# Patient Record
Sex: Male | Born: 1945 | Race: White | Hispanic: Yes | Marital: Married | Smoking: Never smoker
Health system: Southern US, Community
[De-identification: ages and names within clinical notes are randomized; demographics above are authoritative.]

## PROBLEM LIST (undated history)

## (undated) DIAGNOSIS — N529 Male erectile dysfunction, unspecified: Secondary | ICD-10-CM

## (undated) DIAGNOSIS — I471 Supraventricular tachycardia: Secondary | ICD-10-CM

## (undated) DIAGNOSIS — I4719 Other supraventricular tachycardia: Secondary | ICD-10-CM

## (undated) DIAGNOSIS — I10 Essential (primary) hypertension: Secondary | ICD-10-CM

## (undated) DIAGNOSIS — R002 Palpitations: Secondary | ICD-10-CM

## (undated) DIAGNOSIS — E785 Hyperlipidemia, unspecified: Secondary | ICD-10-CM

## (undated) DIAGNOSIS — R7303 Prediabetes: Secondary | ICD-10-CM

## (undated) HISTORY — DX: Male erectile dysfunction, unspecified: N52.9

## (undated) HISTORY — DX: Essential (primary) hypertension: I10

## (undated) HISTORY — DX: Supraventricular tachycardia: I47.1

## (undated) HISTORY — DX: Hyperlipidemia, unspecified: E78.5

## (undated) HISTORY — DX: Other supraventricular tachycardia: I47.19

## (undated) HISTORY — DX: Palpitations: R00.2

## (undated) HISTORY — DX: Prediabetes: R73.03

## (undated) HISTORY — PX: NO PAST SURGERIES: SHX2092

---

## 2018-10-07 DIAGNOSIS — R42 Dizziness and giddiness: Secondary | ICD-10-CM | POA: Diagnosis not present

## 2018-10-07 DIAGNOSIS — E785 Hyperlipidemia, unspecified: Secondary | ICD-10-CM | POA: Diagnosis not present

## 2018-10-07 DIAGNOSIS — R7301 Impaired fasting glucose: Secondary | ICD-10-CM | POA: Diagnosis not present

## 2018-10-07 DIAGNOSIS — Z23 Encounter for immunization: Secondary | ICD-10-CM | POA: Diagnosis not present

## 2018-10-07 DIAGNOSIS — I10 Essential (primary) hypertension: Secondary | ICD-10-CM | POA: Diagnosis not present

## 2018-11-30 DIAGNOSIS — I10 Essential (primary) hypertension: Secondary | ICD-10-CM | POA: Diagnosis not present

## 2018-11-30 DIAGNOSIS — Z131 Encounter for screening for diabetes mellitus: Secondary | ICD-10-CM | POA: Diagnosis not present

## 2018-11-30 DIAGNOSIS — E785 Hyperlipidemia, unspecified: Secondary | ICD-10-CM | POA: Diagnosis not present

## 2018-12-07 DIAGNOSIS — E785 Hyperlipidemia, unspecified: Secondary | ICD-10-CM | POA: Diagnosis not present

## 2018-12-07 DIAGNOSIS — I10 Essential (primary) hypertension: Secondary | ICD-10-CM | POA: Diagnosis not present

## 2018-12-07 DIAGNOSIS — E663 Overweight: Secondary | ICD-10-CM | POA: Diagnosis not present

## 2018-12-07 DIAGNOSIS — R7303 Prediabetes: Secondary | ICD-10-CM | POA: Diagnosis not present

## 2019-06-04 DIAGNOSIS — E785 Hyperlipidemia, unspecified: Secondary | ICD-10-CM | POA: Diagnosis not present

## 2019-06-04 DIAGNOSIS — I10 Essential (primary) hypertension: Secondary | ICD-10-CM | POA: Diagnosis not present

## 2019-06-04 DIAGNOSIS — R7303 Prediabetes: Secondary | ICD-10-CM | POA: Diagnosis not present

## 2019-06-11 DIAGNOSIS — E785 Hyperlipidemia, unspecified: Secondary | ICD-10-CM | POA: Diagnosis not present

## 2019-06-11 DIAGNOSIS — Z6829 Body mass index (BMI) 29.0-29.9, adult: Secondary | ICD-10-CM | POA: Diagnosis not present

## 2019-06-11 DIAGNOSIS — R7303 Prediabetes: Secondary | ICD-10-CM | POA: Diagnosis not present

## 2019-06-11 DIAGNOSIS — I10 Essential (primary) hypertension: Secondary | ICD-10-CM | POA: Diagnosis not present

## 2019-06-16 DIAGNOSIS — Z6827 Body mass index (BMI) 27.0-27.9, adult: Secondary | ICD-10-CM | POA: Diagnosis not present

## 2019-06-16 DIAGNOSIS — N529 Male erectile dysfunction, unspecified: Secondary | ICD-10-CM | POA: Diagnosis not present

## 2019-06-30 DIAGNOSIS — W57XXXA Bitten or stung by nonvenomous insect and other nonvenomous arthropods, initial encounter: Secondary | ICD-10-CM | POA: Diagnosis not present

## 2019-06-30 DIAGNOSIS — Z6826 Body mass index (BMI) 26.0-26.9, adult: Secondary | ICD-10-CM | POA: Diagnosis not present

## 2019-06-30 DIAGNOSIS — N529 Male erectile dysfunction, unspecified: Secondary | ICD-10-CM | POA: Diagnosis not present

## 2019-06-30 DIAGNOSIS — R972 Elevated prostate specific antigen [PSA]: Secondary | ICD-10-CM | POA: Diagnosis not present

## 2019-07-14 DIAGNOSIS — A770 Spotted fever due to Rickettsia rickettsii: Secondary | ICD-10-CM | POA: Diagnosis not present

## 2019-07-14 DIAGNOSIS — Z6827 Body mass index (BMI) 27.0-27.9, adult: Secondary | ICD-10-CM | POA: Diagnosis not present

## 2019-07-23 DIAGNOSIS — R69 Illness, unspecified: Secondary | ICD-10-CM | POA: Diagnosis not present

## 2019-07-28 DIAGNOSIS — N529 Male erectile dysfunction, unspecified: Secondary | ICD-10-CM | POA: Diagnosis not present

## 2019-07-28 DIAGNOSIS — N401 Enlarged prostate with lower urinary tract symptoms: Secondary | ICD-10-CM | POA: Diagnosis not present

## 2019-07-28 DIAGNOSIS — R972 Elevated prostate specific antigen [PSA]: Secondary | ICD-10-CM | POA: Diagnosis not present

## 2019-10-08 DIAGNOSIS — Z23 Encounter for immunization: Secondary | ICD-10-CM | POA: Diagnosis not present

## 2019-12-03 DIAGNOSIS — R7303 Prediabetes: Secondary | ICD-10-CM | POA: Diagnosis not present

## 2019-12-03 DIAGNOSIS — E785 Hyperlipidemia, unspecified: Secondary | ICD-10-CM | POA: Diagnosis not present

## 2019-12-03 DIAGNOSIS — I10 Essential (primary) hypertension: Secondary | ICD-10-CM | POA: Diagnosis not present

## 2019-12-03 DIAGNOSIS — Z125 Encounter for screening for malignant neoplasm of prostate: Secondary | ICD-10-CM | POA: Diagnosis not present

## 2019-12-10 DIAGNOSIS — Z136 Encounter for screening for cardiovascular disorders: Secondary | ICD-10-CM | POA: Diagnosis not present

## 2019-12-10 DIAGNOSIS — Z1331 Encounter for screening for depression: Secondary | ICD-10-CM | POA: Diagnosis not present

## 2019-12-10 DIAGNOSIS — E785 Hyperlipidemia, unspecified: Secondary | ICD-10-CM | POA: Diagnosis not present

## 2019-12-10 DIAGNOSIS — Z139 Encounter for screening, unspecified: Secondary | ICD-10-CM | POA: Diagnosis not present

## 2019-12-10 DIAGNOSIS — I10 Essential (primary) hypertension: Secondary | ICD-10-CM | POA: Diagnosis not present

## 2019-12-10 DIAGNOSIS — R7303 Prediabetes: Secondary | ICD-10-CM | POA: Diagnosis not present

## 2019-12-10 DIAGNOSIS — Z0000 Encounter for general adult medical examination without abnormal findings: Secondary | ICD-10-CM | POA: Diagnosis not present

## 2019-12-10 DIAGNOSIS — Z1339 Encounter for screening examination for other mental health and behavioral disorders: Secondary | ICD-10-CM | POA: Diagnosis not present

## 2019-12-10 DIAGNOSIS — Z6827 Body mass index (BMI) 27.0-27.9, adult: Secondary | ICD-10-CM | POA: Diagnosis not present

## 2019-12-10 DIAGNOSIS — Z7189 Other specified counseling: Secondary | ICD-10-CM | POA: Diagnosis not present

## 2019-12-20 DIAGNOSIS — H52209 Unspecified astigmatism, unspecified eye: Secondary | ICD-10-CM | POA: Diagnosis not present

## 2019-12-20 DIAGNOSIS — H5203 Hypermetropia, bilateral: Secondary | ICD-10-CM | POA: Diagnosis not present

## 2019-12-20 DIAGNOSIS — H524 Presbyopia: Secondary | ICD-10-CM | POA: Diagnosis not present

## 2020-03-07 DIAGNOSIS — R69 Illness, unspecified: Secondary | ICD-10-CM | POA: Diagnosis not present

## 2020-03-21 DIAGNOSIS — R972 Elevated prostate specific antigen [PSA]: Secondary | ICD-10-CM | POA: Diagnosis not present

## 2020-03-21 DIAGNOSIS — N529 Male erectile dysfunction, unspecified: Secondary | ICD-10-CM | POA: Diagnosis not present

## 2020-03-21 DIAGNOSIS — N401 Enlarged prostate with lower urinary tract symptoms: Secondary | ICD-10-CM | POA: Diagnosis not present

## 2020-03-22 DIAGNOSIS — R69 Illness, unspecified: Secondary | ICD-10-CM | POA: Diagnosis not present

## 2020-04-13 DIAGNOSIS — R972 Elevated prostate specific antigen [PSA]: Secondary | ICD-10-CM | POA: Diagnosis not present

## 2020-04-13 DIAGNOSIS — N401 Enlarged prostate with lower urinary tract symptoms: Secondary | ICD-10-CM | POA: Diagnosis not present

## 2020-04-19 DIAGNOSIS — R7303 Prediabetes: Secondary | ICD-10-CM | POA: Diagnosis not present

## 2020-04-19 DIAGNOSIS — E785 Hyperlipidemia, unspecified: Secondary | ICD-10-CM | POA: Diagnosis not present

## 2020-04-25 DIAGNOSIS — C61 Malignant neoplasm of prostate: Secondary | ICD-10-CM | POA: Diagnosis not present

## 2020-04-25 DIAGNOSIS — N401 Enlarged prostate with lower urinary tract symptoms: Secondary | ICD-10-CM | POA: Diagnosis not present

## 2020-04-25 DIAGNOSIS — R972 Elevated prostate specific antigen [PSA]: Secondary | ICD-10-CM | POA: Diagnosis not present

## 2020-04-28 DIAGNOSIS — Z6827 Body mass index (BMI) 27.0-27.9, adult: Secondary | ICD-10-CM | POA: Diagnosis not present

## 2020-04-28 DIAGNOSIS — E785 Hyperlipidemia, unspecified: Secondary | ICD-10-CM | POA: Diagnosis not present

## 2020-04-28 DIAGNOSIS — I10 Essential (primary) hypertension: Secondary | ICD-10-CM | POA: Diagnosis not present

## 2020-04-28 DIAGNOSIS — R7303 Prediabetes: Secondary | ICD-10-CM | POA: Diagnosis not present

## 2020-05-02 DIAGNOSIS — C61 Malignant neoplasm of prostate: Secondary | ICD-10-CM | POA: Diagnosis not present

## 2020-05-08 DIAGNOSIS — K5730 Diverticulosis of large intestine without perforation or abscess without bleeding: Secondary | ICD-10-CM | POA: Diagnosis not present

## 2020-05-08 DIAGNOSIS — C61 Malignant neoplasm of prostate: Secondary | ICD-10-CM | POA: Diagnosis not present

## 2020-05-08 DIAGNOSIS — I700 Atherosclerosis of aorta: Secondary | ICD-10-CM | POA: Diagnosis not present

## 2020-05-23 DIAGNOSIS — C61 Malignant neoplasm of prostate: Secondary | ICD-10-CM | POA: Diagnosis not present

## 2020-05-23 DIAGNOSIS — R351 Nocturia: Secondary | ICD-10-CM | POA: Diagnosis not present

## 2020-05-31 DIAGNOSIS — C61 Malignant neoplasm of prostate: Secondary | ICD-10-CM | POA: Diagnosis not present

## 2020-06-01 DIAGNOSIS — J309 Allergic rhinitis, unspecified: Secondary | ICD-10-CM | POA: Diagnosis not present

## 2020-06-01 DIAGNOSIS — Z008 Encounter for other general examination: Secondary | ICD-10-CM | POA: Diagnosis not present

## 2020-06-01 DIAGNOSIS — C61 Malignant neoplasm of prostate: Secondary | ICD-10-CM | POA: Diagnosis not present

## 2020-06-01 DIAGNOSIS — Z7982 Long term (current) use of aspirin: Secondary | ICD-10-CM | POA: Diagnosis not present

## 2020-06-01 DIAGNOSIS — Z880 Allergy status to penicillin: Secondary | ICD-10-CM | POA: Diagnosis not present

## 2020-06-01 DIAGNOSIS — N529 Male erectile dysfunction, unspecified: Secondary | ICD-10-CM | POA: Diagnosis not present

## 2020-06-01 DIAGNOSIS — E785 Hyperlipidemia, unspecified: Secondary | ICD-10-CM | POA: Diagnosis not present

## 2020-06-01 DIAGNOSIS — I10 Essential (primary) hypertension: Secondary | ICD-10-CM | POA: Diagnosis not present

## 2020-06-01 DIAGNOSIS — Z79899 Other long term (current) drug therapy: Secondary | ICD-10-CM | POA: Diagnosis not present

## 2020-06-06 DIAGNOSIS — C61 Malignant neoplasm of prostate: Secondary | ICD-10-CM | POA: Diagnosis not present

## 2020-07-07 DIAGNOSIS — R351 Nocturia: Secondary | ICD-10-CM | POA: Diagnosis not present

## 2020-07-07 DIAGNOSIS — C61 Malignant neoplasm of prostate: Secondary | ICD-10-CM | POA: Diagnosis not present

## 2020-08-08 DIAGNOSIS — R351 Nocturia: Secondary | ICD-10-CM | POA: Diagnosis not present

## 2020-08-08 DIAGNOSIS — C61 Malignant neoplasm of prostate: Secondary | ICD-10-CM | POA: Diagnosis not present

## 2020-08-23 DIAGNOSIS — C61 Malignant neoplasm of prostate: Secondary | ICD-10-CM | POA: Diagnosis not present

## 2020-08-23 DIAGNOSIS — Z510 Encounter for antineoplastic radiation therapy: Secondary | ICD-10-CM | POA: Diagnosis not present

## 2020-08-30 DIAGNOSIS — C61 Malignant neoplasm of prostate: Secondary | ICD-10-CM | POA: Diagnosis not present

## 2020-08-30 DIAGNOSIS — Z510 Encounter for antineoplastic radiation therapy: Secondary | ICD-10-CM | POA: Diagnosis not present

## 2020-08-31 DIAGNOSIS — C61 Malignant neoplasm of prostate: Secondary | ICD-10-CM | POA: Diagnosis not present

## 2020-08-31 DIAGNOSIS — Z510 Encounter for antineoplastic radiation therapy: Secondary | ICD-10-CM | POA: Diagnosis not present

## 2020-09-05 DIAGNOSIS — C61 Malignant neoplasm of prostate: Secondary | ICD-10-CM | POA: Diagnosis not present

## 2020-09-05 DIAGNOSIS — Z510 Encounter for antineoplastic radiation therapy: Secondary | ICD-10-CM | POA: Diagnosis not present

## 2020-09-06 DIAGNOSIS — Z510 Encounter for antineoplastic radiation therapy: Secondary | ICD-10-CM | POA: Diagnosis not present

## 2020-09-06 DIAGNOSIS — C61 Malignant neoplasm of prostate: Secondary | ICD-10-CM | POA: Diagnosis not present

## 2020-09-07 DIAGNOSIS — Z510 Encounter for antineoplastic radiation therapy: Secondary | ICD-10-CM | POA: Diagnosis not present

## 2020-09-07 DIAGNOSIS — C61 Malignant neoplasm of prostate: Secondary | ICD-10-CM | POA: Diagnosis not present

## 2020-09-08 DIAGNOSIS — C61 Malignant neoplasm of prostate: Secondary | ICD-10-CM | POA: Diagnosis not present

## 2020-09-08 DIAGNOSIS — Z510 Encounter for antineoplastic radiation therapy: Secondary | ICD-10-CM | POA: Diagnosis not present

## 2020-09-11 DIAGNOSIS — Z510 Encounter for antineoplastic radiation therapy: Secondary | ICD-10-CM | POA: Diagnosis not present

## 2020-09-11 DIAGNOSIS — C61 Malignant neoplasm of prostate: Secondary | ICD-10-CM | POA: Diagnosis not present

## 2020-09-12 DIAGNOSIS — Z510 Encounter for antineoplastic radiation therapy: Secondary | ICD-10-CM | POA: Diagnosis not present

## 2020-09-12 DIAGNOSIS — C61 Malignant neoplasm of prostate: Secondary | ICD-10-CM | POA: Diagnosis not present

## 2020-09-13 DIAGNOSIS — C61 Malignant neoplasm of prostate: Secondary | ICD-10-CM | POA: Diagnosis not present

## 2020-09-13 DIAGNOSIS — Z510 Encounter for antineoplastic radiation therapy: Secondary | ICD-10-CM | POA: Diagnosis not present

## 2020-09-14 DIAGNOSIS — C61 Malignant neoplasm of prostate: Secondary | ICD-10-CM | POA: Diagnosis not present

## 2020-09-14 DIAGNOSIS — Z510 Encounter for antineoplastic radiation therapy: Secondary | ICD-10-CM | POA: Diagnosis not present

## 2020-09-15 DIAGNOSIS — C61 Malignant neoplasm of prostate: Secondary | ICD-10-CM | POA: Diagnosis not present

## 2020-09-15 DIAGNOSIS — Z510 Encounter for antineoplastic radiation therapy: Secondary | ICD-10-CM | POA: Diagnosis not present

## 2020-09-18 DIAGNOSIS — C61 Malignant neoplasm of prostate: Secondary | ICD-10-CM | POA: Diagnosis not present

## 2020-09-18 DIAGNOSIS — Z510 Encounter for antineoplastic radiation therapy: Secondary | ICD-10-CM | POA: Diagnosis not present

## 2020-09-19 DIAGNOSIS — Z510 Encounter for antineoplastic radiation therapy: Secondary | ICD-10-CM | POA: Diagnosis not present

## 2020-09-19 DIAGNOSIS — C61 Malignant neoplasm of prostate: Secondary | ICD-10-CM | POA: Diagnosis not present

## 2020-09-20 DIAGNOSIS — C61 Malignant neoplasm of prostate: Secondary | ICD-10-CM | POA: Diagnosis not present

## 2020-09-20 DIAGNOSIS — Z510 Encounter for antineoplastic radiation therapy: Secondary | ICD-10-CM | POA: Diagnosis not present

## 2020-09-21 DIAGNOSIS — C61 Malignant neoplasm of prostate: Secondary | ICD-10-CM | POA: Diagnosis not present

## 2020-09-21 DIAGNOSIS — Z510 Encounter for antineoplastic radiation therapy: Secondary | ICD-10-CM | POA: Diagnosis not present

## 2020-09-22 DIAGNOSIS — C61 Malignant neoplasm of prostate: Secondary | ICD-10-CM | POA: Diagnosis not present

## 2020-09-22 DIAGNOSIS — R351 Nocturia: Secondary | ICD-10-CM | POA: Diagnosis not present

## 2020-09-22 DIAGNOSIS — Z510 Encounter for antineoplastic radiation therapy: Secondary | ICD-10-CM | POA: Diagnosis not present

## 2020-09-25 DIAGNOSIS — C61 Malignant neoplasm of prostate: Secondary | ICD-10-CM | POA: Diagnosis not present

## 2020-09-25 DIAGNOSIS — Z510 Encounter for antineoplastic radiation therapy: Secondary | ICD-10-CM | POA: Diagnosis not present

## 2020-09-26 DIAGNOSIS — C61 Malignant neoplasm of prostate: Secondary | ICD-10-CM | POA: Diagnosis not present

## 2020-09-26 DIAGNOSIS — Z510 Encounter for antineoplastic radiation therapy: Secondary | ICD-10-CM | POA: Diagnosis not present

## 2020-09-27 DIAGNOSIS — Z510 Encounter for antineoplastic radiation therapy: Secondary | ICD-10-CM | POA: Diagnosis not present

## 2020-09-27 DIAGNOSIS — C61 Malignant neoplasm of prostate: Secondary | ICD-10-CM | POA: Diagnosis not present

## 2020-09-28 DIAGNOSIS — Z510 Encounter for antineoplastic radiation therapy: Secondary | ICD-10-CM | POA: Diagnosis not present

## 2020-09-28 DIAGNOSIS — C61 Malignant neoplasm of prostate: Secondary | ICD-10-CM | POA: Diagnosis not present

## 2020-09-29 DIAGNOSIS — C61 Malignant neoplasm of prostate: Secondary | ICD-10-CM | POA: Diagnosis not present

## 2020-09-29 DIAGNOSIS — Z510 Encounter for antineoplastic radiation therapy: Secondary | ICD-10-CM | POA: Diagnosis not present

## 2020-10-02 DIAGNOSIS — C61 Malignant neoplasm of prostate: Secondary | ICD-10-CM | POA: Diagnosis not present

## 2020-10-02 DIAGNOSIS — Z510 Encounter for antineoplastic radiation therapy: Secondary | ICD-10-CM | POA: Diagnosis not present

## 2020-10-02 DIAGNOSIS — R69 Illness, unspecified: Secondary | ICD-10-CM | POA: Diagnosis not present

## 2020-10-03 DIAGNOSIS — Z510 Encounter for antineoplastic radiation therapy: Secondary | ICD-10-CM | POA: Diagnosis not present

## 2020-10-03 DIAGNOSIS — C61 Malignant neoplasm of prostate: Secondary | ICD-10-CM | POA: Diagnosis not present

## 2020-10-04 DIAGNOSIS — C61 Malignant neoplasm of prostate: Secondary | ICD-10-CM | POA: Diagnosis not present

## 2020-10-04 DIAGNOSIS — Z510 Encounter for antineoplastic radiation therapy: Secondary | ICD-10-CM | POA: Diagnosis not present

## 2020-10-05 DIAGNOSIS — C61 Malignant neoplasm of prostate: Secondary | ICD-10-CM | POA: Diagnosis not present

## 2020-10-05 DIAGNOSIS — Z510 Encounter for antineoplastic radiation therapy: Secondary | ICD-10-CM | POA: Diagnosis not present

## 2020-10-06 DIAGNOSIS — C61 Malignant neoplasm of prostate: Secondary | ICD-10-CM | POA: Diagnosis not present

## 2020-10-06 DIAGNOSIS — Z510 Encounter for antineoplastic radiation therapy: Secondary | ICD-10-CM | POA: Diagnosis not present

## 2020-10-09 DIAGNOSIS — Z510 Encounter for antineoplastic radiation therapy: Secondary | ICD-10-CM | POA: Diagnosis not present

## 2020-10-09 DIAGNOSIS — C61 Malignant neoplasm of prostate: Secondary | ICD-10-CM | POA: Diagnosis not present

## 2020-10-10 DIAGNOSIS — Z510 Encounter for antineoplastic radiation therapy: Secondary | ICD-10-CM | POA: Diagnosis not present

## 2020-10-10 DIAGNOSIS — C61 Malignant neoplasm of prostate: Secondary | ICD-10-CM | POA: Diagnosis not present

## 2020-10-11 DIAGNOSIS — C61 Malignant neoplasm of prostate: Secondary | ICD-10-CM | POA: Diagnosis not present

## 2020-10-11 DIAGNOSIS — Z510 Encounter for antineoplastic radiation therapy: Secondary | ICD-10-CM | POA: Diagnosis not present

## 2020-10-12 DIAGNOSIS — Z510 Encounter for antineoplastic radiation therapy: Secondary | ICD-10-CM | POA: Diagnosis not present

## 2020-10-12 DIAGNOSIS — C61 Malignant neoplasm of prostate: Secondary | ICD-10-CM | POA: Diagnosis not present

## 2020-10-13 DIAGNOSIS — C61 Malignant neoplasm of prostate: Secondary | ICD-10-CM | POA: Diagnosis not present

## 2020-10-13 DIAGNOSIS — Z510 Encounter for antineoplastic radiation therapy: Secondary | ICD-10-CM | POA: Diagnosis not present

## 2020-10-16 DIAGNOSIS — Z510 Encounter for antineoplastic radiation therapy: Secondary | ICD-10-CM | POA: Diagnosis not present

## 2020-10-16 DIAGNOSIS — C61 Malignant neoplasm of prostate: Secondary | ICD-10-CM | POA: Diagnosis not present

## 2020-10-17 DIAGNOSIS — C61 Malignant neoplasm of prostate: Secondary | ICD-10-CM | POA: Diagnosis not present

## 2020-10-17 DIAGNOSIS — Z510 Encounter for antineoplastic radiation therapy: Secondary | ICD-10-CM | POA: Diagnosis not present

## 2020-10-18 DIAGNOSIS — C61 Malignant neoplasm of prostate: Secondary | ICD-10-CM | POA: Diagnosis not present

## 2020-10-18 DIAGNOSIS — Z510 Encounter for antineoplastic radiation therapy: Secondary | ICD-10-CM | POA: Diagnosis not present

## 2020-10-19 DIAGNOSIS — Z510 Encounter for antineoplastic radiation therapy: Secondary | ICD-10-CM | POA: Diagnosis not present

## 2020-10-19 DIAGNOSIS — C61 Malignant neoplasm of prostate: Secondary | ICD-10-CM | POA: Diagnosis not present

## 2020-10-20 DIAGNOSIS — Z510 Encounter for antineoplastic radiation therapy: Secondary | ICD-10-CM | POA: Diagnosis not present

## 2020-10-20 DIAGNOSIS — C61 Malignant neoplasm of prostate: Secondary | ICD-10-CM | POA: Diagnosis not present

## 2020-10-20 DIAGNOSIS — R7303 Prediabetes: Secondary | ICD-10-CM | POA: Diagnosis not present

## 2020-10-20 DIAGNOSIS — E785 Hyperlipidemia, unspecified: Secondary | ICD-10-CM | POA: Diagnosis not present

## 2020-10-20 DIAGNOSIS — Z125 Encounter for screening for malignant neoplasm of prostate: Secondary | ICD-10-CM | POA: Diagnosis not present

## 2020-10-23 DIAGNOSIS — Z510 Encounter for antineoplastic radiation therapy: Secondary | ICD-10-CM | POA: Diagnosis not present

## 2020-10-23 DIAGNOSIS — C61 Malignant neoplasm of prostate: Secondary | ICD-10-CM | POA: Diagnosis not present

## 2020-10-24 DIAGNOSIS — C61 Malignant neoplasm of prostate: Secondary | ICD-10-CM | POA: Diagnosis not present

## 2020-10-24 DIAGNOSIS — Z510 Encounter for antineoplastic radiation therapy: Secondary | ICD-10-CM | POA: Diagnosis not present

## 2020-10-25 DIAGNOSIS — Z510 Encounter for antineoplastic radiation therapy: Secondary | ICD-10-CM | POA: Diagnosis not present

## 2020-10-25 DIAGNOSIS — R69 Illness, unspecified: Secondary | ICD-10-CM | POA: Diagnosis not present

## 2020-10-25 DIAGNOSIS — C61 Malignant neoplasm of prostate: Secondary | ICD-10-CM | POA: Diagnosis not present

## 2020-10-26 DIAGNOSIS — C61 Malignant neoplasm of prostate: Secondary | ICD-10-CM | POA: Diagnosis not present

## 2020-10-26 DIAGNOSIS — Z510 Encounter for antineoplastic radiation therapy: Secondary | ICD-10-CM | POA: Diagnosis not present

## 2020-10-27 DIAGNOSIS — Z23 Encounter for immunization: Secondary | ICD-10-CM | POA: Diagnosis not present

## 2020-10-27 DIAGNOSIS — Z510 Encounter for antineoplastic radiation therapy: Secondary | ICD-10-CM | POA: Diagnosis not present

## 2020-10-27 DIAGNOSIS — R7303 Prediabetes: Secondary | ICD-10-CM | POA: Diagnosis not present

## 2020-10-27 DIAGNOSIS — E785 Hyperlipidemia, unspecified: Secondary | ICD-10-CM | POA: Diagnosis not present

## 2020-10-27 DIAGNOSIS — I10 Essential (primary) hypertension: Secondary | ICD-10-CM | POA: Diagnosis not present

## 2020-10-27 DIAGNOSIS — C61 Malignant neoplasm of prostate: Secondary | ICD-10-CM | POA: Diagnosis not present

## 2020-10-30 DIAGNOSIS — Z510 Encounter for antineoplastic radiation therapy: Secondary | ICD-10-CM | POA: Diagnosis not present

## 2020-10-30 DIAGNOSIS — C61 Malignant neoplasm of prostate: Secondary | ICD-10-CM | POA: Diagnosis not present

## 2020-11-08 DIAGNOSIS — R351 Nocturia: Secondary | ICD-10-CM | POA: Diagnosis not present

## 2020-11-08 DIAGNOSIS — C61 Malignant neoplasm of prostate: Secondary | ICD-10-CM | POA: Diagnosis not present

## 2021-03-08 DIAGNOSIS — R351 Nocturia: Secondary | ICD-10-CM | POA: Diagnosis not present

## 2021-03-08 DIAGNOSIS — C61 Malignant neoplasm of prostate: Secondary | ICD-10-CM | POA: Diagnosis not present

## 2021-04-05 DIAGNOSIS — H5203 Hypermetropia, bilateral: Secondary | ICD-10-CM | POA: Diagnosis not present

## 2021-04-16 DIAGNOSIS — E785 Hyperlipidemia, unspecified: Secondary | ICD-10-CM | POA: Diagnosis not present

## 2021-04-16 DIAGNOSIS — R7303 Prediabetes: Secondary | ICD-10-CM | POA: Diagnosis not present

## 2021-04-23 DIAGNOSIS — Z139 Encounter for screening, unspecified: Secondary | ICD-10-CM | POA: Diagnosis not present

## 2021-04-23 DIAGNOSIS — I10 Essential (primary) hypertension: Secondary | ICD-10-CM | POA: Diagnosis not present

## 2021-04-23 DIAGNOSIS — Z23 Encounter for immunization: Secondary | ICD-10-CM | POA: Diagnosis not present

## 2021-04-23 DIAGNOSIS — E785 Hyperlipidemia, unspecified: Secondary | ICD-10-CM | POA: Diagnosis not present

## 2021-04-23 DIAGNOSIS — R7303 Prediabetes: Secondary | ICD-10-CM | POA: Diagnosis not present

## 2021-06-08 DIAGNOSIS — N529 Male erectile dysfunction, unspecified: Secondary | ICD-10-CM | POA: Diagnosis not present

## 2021-06-08 DIAGNOSIS — C61 Malignant neoplasm of prostate: Secondary | ICD-10-CM | POA: Diagnosis not present

## 2021-06-08 DIAGNOSIS — R351 Nocturia: Secondary | ICD-10-CM | POA: Diagnosis not present

## 2021-06-20 DIAGNOSIS — Z6827 Body mass index (BMI) 27.0-27.9, adult: Secondary | ICD-10-CM | POA: Diagnosis not present

## 2021-06-20 DIAGNOSIS — R002 Palpitations: Secondary | ICD-10-CM | POA: Diagnosis not present

## 2021-07-20 DIAGNOSIS — R002 Palpitations: Secondary | ICD-10-CM | POA: Diagnosis not present

## 2021-07-20 DIAGNOSIS — I471 Supraventricular tachycardia: Secondary | ICD-10-CM | POA: Diagnosis not present

## 2021-07-20 DIAGNOSIS — Z6827 Body mass index (BMI) 27.0-27.9, adult: Secondary | ICD-10-CM | POA: Diagnosis not present

## 2021-07-26 ENCOUNTER — Encounter: Payer: Self-pay | Admitting: Cardiology

## 2021-08-22 DIAGNOSIS — E785 Hyperlipidemia, unspecified: Secondary | ICD-10-CM | POA: Diagnosis not present

## 2021-08-22 DIAGNOSIS — I10 Essential (primary) hypertension: Secondary | ICD-10-CM | POA: Diagnosis not present

## 2021-08-22 DIAGNOSIS — N529 Male erectile dysfunction, unspecified: Secondary | ICD-10-CM | POA: Diagnosis not present

## 2021-08-22 DIAGNOSIS — Z79899 Other long term (current) drug therapy: Secondary | ICD-10-CM | POA: Diagnosis not present

## 2021-08-22 DIAGNOSIS — Z8249 Family history of ischemic heart disease and other diseases of the circulatory system: Secondary | ICD-10-CM | POA: Diagnosis not present

## 2021-08-22 DIAGNOSIS — J309 Allergic rhinitis, unspecified: Secondary | ICD-10-CM | POA: Diagnosis not present

## 2021-08-22 DIAGNOSIS — Z7982 Long term (current) use of aspirin: Secondary | ICD-10-CM | POA: Diagnosis not present

## 2021-08-22 DIAGNOSIS — E663 Overweight: Secondary | ICD-10-CM | POA: Diagnosis not present

## 2021-08-22 DIAGNOSIS — Z6826 Body mass index (BMI) 26.0-26.9, adult: Secondary | ICD-10-CM | POA: Diagnosis not present

## 2021-08-22 DIAGNOSIS — I499 Cardiac arrhythmia, unspecified: Secondary | ICD-10-CM | POA: Diagnosis not present

## 2021-09-21 DIAGNOSIS — I471 Supraventricular tachycardia: Secondary | ICD-10-CM | POA: Diagnosis not present

## 2021-09-21 DIAGNOSIS — Z23 Encounter for immunization: Secondary | ICD-10-CM | POA: Diagnosis not present

## 2021-09-21 DIAGNOSIS — Z6828 Body mass index (BMI) 28.0-28.9, adult: Secondary | ICD-10-CM | POA: Diagnosis not present

## 2021-10-05 ENCOUNTER — Ambulatory Visit (INDEPENDENT_AMBULATORY_CARE_PROVIDER_SITE_OTHER): Payer: Medicare HMO

## 2021-10-05 ENCOUNTER — Other Ambulatory Visit: Payer: Self-pay

## 2021-10-05 ENCOUNTER — Encounter: Payer: Self-pay | Admitting: Cardiology

## 2021-10-05 ENCOUNTER — Ambulatory Visit (INDEPENDENT_AMBULATORY_CARE_PROVIDER_SITE_OTHER): Payer: Medicare HMO | Admitting: Cardiology

## 2021-10-05 VITALS — BP 88/46 | HR 145 | Ht 66.0 in | Wt 170.8 lb

## 2021-10-05 DIAGNOSIS — I4892 Unspecified atrial flutter: Secondary | ICD-10-CM | POA: Diagnosis not present

## 2021-10-05 DIAGNOSIS — I483 Typical atrial flutter: Secondary | ICD-10-CM | POA: Diagnosis not present

## 2021-10-05 DIAGNOSIS — R7303 Prediabetes: Secondary | ICD-10-CM

## 2021-10-05 DIAGNOSIS — I10 Essential (primary) hypertension: Secondary | ICD-10-CM | POA: Insufficient documentation

## 2021-10-05 DIAGNOSIS — E785 Hyperlipidemia, unspecified: Secondary | ICD-10-CM

## 2021-10-05 HISTORY — DX: Essential (primary) hypertension: I10

## 2021-10-05 HISTORY — DX: Unspecified atrial flutter: I48.92

## 2021-10-05 HISTORY — DX: Hyperlipidemia, unspecified: E78.5

## 2021-10-05 HISTORY — DX: Prediabetes: R73.03

## 2021-10-05 MED ORDER — METOPROLOL SUCCINATE ER 50 MG PO TB24: 50.0000 mg | ORAL_TABLET | Freq: Every day | ORAL | 3 refills | Status: DC

## 2021-10-05 NOTE — Patient Instructions (Signed)
Medication Instructions:  Your physician has recommended you make the following change in your medication:  STOP: METOPROLOL (Lopressor)  STOP: Enalapril  *If you need a refill on your cardiac medications before your next appointment, please call your pharmacy*   Lab Work: Your physician recommends that you return for lab work in: TODAY PT/INR, CBC, BMP, IFOB If you have labs (blood work) drawn today and your tests are completely normal, you will receive your results only by: [place] (if you have MyChart) OR A paper copy in the mail If you have any lab test that is abnormal or we need to change your treatment, we will call you to review the results.   Testing/Procedures: Your physician has requested that you have an echocardiogram. Echocardiography is a painless test that uses sound waves to create images of your heart. It provides your doctor with information about the size and shape of your heart and how well your heart's chambers and valves are working. This procedure takes approximately one hour. There are no restrictions for this procedure.  A zio monitor was ordered today. It will remain on for 7 days. You will then return monitor and event diary in provided box. It takes 1-2 weeks for report to be downloaded and returned to [place]. We will call you with the results. If monitor falls off or has orange flashing light, please call Zio for further instructions.     Follow-Up: At [facility], you and your health needs are our priority.  As part of our continuing mission to provide you with exceptional heart care, we have created designated Provider Care Teams.  These Care Teams include your primary Cardiologist (physician) and Advanced Practice Providers (APPs -  Physician Assistants and Nurse Practitioners) who all work together to provide you with the care you need, when you need it.  We recommend signing up for the patient portal called "MyChart".  Sign up information is  provided on this After Visit Summary.  MyChart is used to connect with patients for Virtual Visits (Telemedicine).  Patients are able to view lab/test results, encounter notes, upcoming appointments, etc.  Non-urgent messages can be sent to your provider as well.   To learn more about what you can do with MyChart, go to [email or web address].    Your next appointment:   3 week(s)  The format for your next appointment:   In Person  Provider:   Jenne Campus, MD   Other Instructions

## 2021-10-05 NOTE — Progress Notes (Signed)
Cardiology Consultation:    Date:  10/05/2021   ID:  Demarqus, Jocson 21-Feb-1946, MRN [id number]  PCP:  Maryella Shivers, MD  Cardiologist:  Jenne Campus, MD   Referring MD: Maryella Shivers, MD   Chief Complaint  Patient presents with   Palpitations   Tachycardia   elevated HR    Ongoing for     History of Present Illness:    Maxwell Hopkins is a 75 y.o. male who is being seen today for the evaluation of tachycardia at the request of Maryella Shivers, MD. past medical history significant for essential hypertension, dyslipidemia, borderline diabetes.  In the summertime he was noted to be tachycardic.  His EKG at that time showed atrial flutter.  He was eventually referred to [place].  He has been given metoprolol 25 mg that he takes once a day which calm his heart rate down.  He tells me that overall he is doing well.  He denies have any chest pain tightness squeezing pressure burning chest he describes having fatigue and tiredness when his heart speeds up.  Usually he said when he wakes up in the morning his heart will beat fast that he takes metoprolol and heart quiet down.  He never had a problem like this before.  He does not smoke does not have family history of premature coronary artery disease.  Never had a stroke or TIA.  He is still active and have no difficulty doing things  Past Medical History:  Diagnosis Date   Atrial tachycardia [facility])    ED (erectile dysfunction)    Hyperlipidemia    Hypertension    Palpitations    Prediabetes     Past Surgical History:  Procedure Laterality Date   NO PAST SURGERIES      Current Medications: Current Meds  Medication Sig   aspirin EC 81 MG tablet Take 81 mg by mouth daily. Swallow whole.   atorvastatin (LIPITOR) 40 MG tablet Take 40 mg by mouth daily.   fluticasone (FLONASE) 50 MCG/ACT nasal spray Place 1 spray into both nostrils daily.   metoprolol succinate (TOPROL-XL) 50 MG 24 hr tablet Take 1 tablet (50 mg total)  by mouth daily. Take with or immediately following a meal.   tadalafil (CIALIS) 5 MG tablet Take 5 mg by mouth daily.   [DISCONTINUED] enalapril (VASOTEC) 5 MG tablet Take 2.5 mg by mouth daily.   [DISCONTINUED] metoprolol tartrate (LOPRESSOR) 25 MG tablet Take 25 mg by mouth 2 (two) times daily.     Allergies:   Penicillin g   Social History   Socioeconomic History   Marital status: Married    Spouse name: Not on file   Number of children: Not on file   Years of education: Not on file   Highest education level: Not on file  Occupational History   Not on file  Tobacco Use   Smoking status: Never   Smokeless tobacco: Never  Substance and Sexual Activity   Alcohol use: Not Currently   Drug use: Never   Sexual activity: Yes  Other Topics Concern   Not on file  Social History Narrative   Not on file   Social Determinants of Health   Financial Resource Strain: Not on file  Food Insecurity: Not on file  Transportation Needs: Not on file  Physical Activity: Not on file  Stress: Not on file  Social Connections: Not on file     Family History: The patient's Family history is unknown by  patient. ROS:   Please see the history of present illness.    All 14 point review of systems negative except as described per history of present illness.  EKGs/Labs/Other Studies Reviewed:    The following studies were reviewed today: Atrial flutter with 2 1 AV conduction  EKG:    The ekg ordered today demonstrates atrial flutter with 221 AV conduction  Recent Labs: No results found for requested labs within last 8760 hours.  Recent Lipid Panel No results found for: CHOL, TRIG, HDL, CHOLHDL, VLDL, LDLCALC, LDLDIRECT  Physical Exam:    VS:  BP (!) 88/46 (BP Location: Left Arm, Patient Position: Sitting)   Pulse (!) 145   Ht 5\' 6"  (1.676 m)   Wt 170 lb 12.8 oz (77.5 kg)   SpO2 97%   BMI 27.57 kg/m     Wt Readings from Last 3 Encounters:  10/05/21 170 lb 12.8 oz (77.5 kg)   07/20/21 166 lb 2 oz (75.4 kg)     GEN:  Well nourished, well developed in no acute distress HEENT: Normal NECK: No JVD; No carotid bruits LYMPHATICS: No lymphadenopathy CARDIAC: RRR, tachycardic, no murmurs, no rubs, no gallops RESPIRATORY:  Clear to auscultation without rales, wheezing or rhonchi  ABDOMEN: Soft, non-tender, non-distended MUSCULOSKELETAL:  No edema; No deformity  SKIN: Warm and dry NEUROLOGIC:  Alert and oriented x 3 PSYCHIATRIC:  Normal affect   ASSESSMENT:    1. Atrial flutter, unspecified type ([place])   2. Typical atrial flutter ([place])   3. Essential hypertension   4. Dyslipidemia   5. Borderline diabetes    PLAN:    In order of problems listed above:  Typical atrial flutter.  Rate is uncontrolled.  His blood pressure is low.  Patient is completely asymptomatic we will even talk about potentially putting him in the hospital to quickly manage this problem but since he is absolutely completely asymptomatic I think we can try to manage this as an outpatient.  I will ask him to stop ACE inhibitor he is taking enalapril and will discontinue that I will switch him to long-acting for metoprolol and I will ask him to start taking 50 mg metoprolol succinate every single day in the morning.  His CHADS2 Vascor equals 3, therefore, he need to be anticoagulated.  I will check his CBC Chem-7 PT/INR stool for guaiac and if everything is fine we will stop his aspirin and put him on Eliquis 5 mg twice daily.  He also will be scheduled to have an echocardiogram to assess left ventricle ejection fraction.  I will schedule him to have a Zio patch for 1 week to see if his ventricular rate is reasonably controlled.  After 4 weeks of anticoagulation we can start thinking about potentially getting back to normal rhythm he will most likely require cardioversion. Essential hypertension his blood pressure actually is low today but he is tachycardic in atrial flutter.  We will adjust his  medication as described above. Dyslipidemia, he is taking atorvastatin 40 I do have his fasting lipid profile from April 16, 2021 with LDL 100 HDL 43 Borderline diabetes.  I do see his hemoglobin A1c from 04/16/2021 which is 6.1   Medication Adjustments/Labs and Tests Ordered: Current medicines are reviewed at length with the patient today.  Concerns regarding medicines are outlined above.  Orders Placed This Encounter  Procedures   Fecal occult blood, imunochemical([facility])   Protime-INR   Basic metabolic panel   CBC   LONG TERM MONITOR (3-14  DAYS)   EKG 12-Lead   ECHOCARDIOGRAM COMPLETE   Meds ordered this encounter  Medications   metoprolol succinate (TOPROL-XL) 50 MG 24 hr tablet    Sig: Take 1 tablet (50 mg total) by mouth daily. Take with or immediately following a meal.    Dispense:  90 tablet    Refill:  3    Signed, Park Liter, MD, [facility]. 10/05/2021 11:51 AM    [facility]

## 2021-10-06 LAB — BASIC METABOLIC PANEL
BUN/Creatinine Ratio: 21 (ref 10–24)
BUN: 19 mg/dL (ref 8–27)
CO2: 24 mmol/L (ref 20–29)
Calcium: 9.7 mg/dL (ref 8.6–10.2)
Chloride: 101 mmol/L (ref 96–106)
Creatinine, Ser: 0.89 mg/dL (ref 0.76–1.27)
Glucose: 112 mg/dL — ABNORMAL HIGH (ref 70–99)
Potassium: 4.7 mmol/L (ref 3.5–5.2)
Sodium: 139 mmol/L (ref 134–144)
eGFR: 90 mL/min/{1.73_m2} (ref >59)

## 2021-10-06 LAB — CBC
Hematocrit: 38.5 % (ref 37.5–51.0)
Hemoglobin: 13 g/dL (ref 13.0–17.7)
MCH: 32.7 pg (ref 26.6–33.0)
MCHC: 33.8 g/dL (ref 31.5–35.7)
MCV: 97 fL (ref 79–97)
Platelets: 196 10*3/uL (ref 150–450)
RBC: 3.97 x10E6/uL — ABNORMAL LOW (ref 4.14–5.80)
RDW: 12.6 % (ref 11.6–15.4)
WBC: 5.6 10*3/uL (ref 3.4–10.8)

## 2021-10-06 LAB — PROTIME-INR
INR: 1 (ref 0.9–1.2)
Prothrombin Time: 10.6 s (ref 9.1–12.0)

## 2021-10-09 ENCOUNTER — Telehealth: Payer: Self-pay | Admitting: Emergency Medicine

## 2021-10-09 DIAGNOSIS — Z79899 Other long term (current) drug therapy: Secondary | ICD-10-CM

## 2021-10-09 MED ORDER — APIXABAN 5 MG PO TABS: 5.0000 mg | ORAL_TABLET | Freq: Two times a day (BID) | ORAL | 2 refills | Status: DC

## 2021-10-09 NOTE — Telephone Encounter (Signed)
-----   Message from Park Liter, MD sent at 10/08/2021  9:38 PM EDT ----- Stop aspirin, start Eliquis 5 mg twice daily, lets do H&H in about 10 days

## 2021-10-09 NOTE — Telephone Encounter (Signed)
Spoke to patient via interpreter. Patient informed of results. He will stop aspirin and start eliquis 5 mg twice daily. He will have blood work drawn in 10 days.

## 2021-10-12 ENCOUNTER — Telehealth: Payer: Self-pay | Admitting: Cardiology

## 2021-10-12 DIAGNOSIS — I4892 Unspecified atrial flutter: Secondary | ICD-10-CM

## 2021-10-12 NOTE — Telephone Encounter (Signed)
Patient states he removed his heart monitor and now it is flashing red.  He would like to know if this is normal. Please advise.  He requested a call back from a Spanish speaking interpreter.

## 2021-10-12 NOTE — Telephone Encounter (Signed)
Spoke to the patient just now using the interpreter and the patient stated that he had already spoken to someone regarding the monitor. He said he had no further questions.

## 2021-10-19 DIAGNOSIS — Z79899 Other long term (current) drug therapy: Secondary | ICD-10-CM | POA: Diagnosis not present

## 2021-10-20 LAB — HEMOGLOBIN AND HEMATOCRIT, BLOOD
Hematocrit: 34.2 % — ABNORMAL LOW (ref 37.5–51.0)
Hemoglobin: 11.7 g/dL — ABNORMAL LOW (ref 13.0–17.7)

## 2021-10-22 DIAGNOSIS — I4892 Unspecified atrial flutter: Secondary | ICD-10-CM | POA: Diagnosis not present

## 2021-10-23 ENCOUNTER — Telehealth: Payer: Self-pay

## 2021-10-23 DIAGNOSIS — I4892 Unspecified atrial flutter: Secondary | ICD-10-CM | POA: Diagnosis not present

## 2021-10-23 NOTE — Telephone Encounter (Signed)
Patient aware.

## 2021-10-23 NOTE — Telephone Encounter (Signed)
-----   Message from Park Liter, MD sent at 10/22/2021 10:06 AM EDT ----- Labs showed minimal decrease in H&H.  Please repeat H&H in 2 weeks

## 2021-10-23 NOTE — Telephone Encounter (Signed)
-----   Message from Park Liter, MD sent at 10/23/2021  9:57 AM EDT ----- I do not see any issue with restarting those vitamins. ----- Message ----- From: Darrel Reach, CMA Sent: 10/23/2021   9:26 AM EDT To: Park Liter, MD  Patient notified of results and recommendations, agree with plan.  Patient asked can he resume taking Vitamin E 800mg  and C 1000mg  daily? Please advise  ----- Message ----- From: Park Liter, MD Sent: 10/22/2021  10:06 AM EDT To: Senaida Ores, RN  Labs showed minimal decrease in H&H.  Please repeat H&H in 2 weeks

## 2021-10-23 NOTE — Telephone Encounter (Signed)
Patient notified of results and recommendations, agree with plan. Order placed, aware to come by in 2 week. Patient did ask if he could resume his Vitamins E and C. Message sent to Dr. Raliegh Ip.

## 2021-10-25 ENCOUNTER — Ambulatory Visit (INDEPENDENT_AMBULATORY_CARE_PROVIDER_SITE_OTHER): Payer: Medicare HMO

## 2021-10-25 ENCOUNTER — Other Ambulatory Visit: Payer: Self-pay

## 2021-10-25 ENCOUNTER — Telehealth: Payer: Self-pay

## 2021-10-25 DIAGNOSIS — I4892 Unspecified atrial flutter: Secondary | ICD-10-CM | POA: Diagnosis not present

## 2021-10-25 LAB — ECHOCARDIOGRAM COMPLETE
Area-P 1/2: 7.74 cm2
S' Lateral: 2.6 cm

## 2021-10-25 LAB — FECAL OCCULT BLOOD, IMMUNOCHEMICAL: Fecal Occult Bld: NEGATIVE

## 2021-10-25 MED ORDER — METOPROLOL SUCCINATE ER 50 MG PO TB24: ORAL_TABLET | ORAL | 1 refills | Status: DC

## 2021-10-25 NOTE — Telephone Encounter (Signed)
I called [facility], left a detailed message regarding previous message. I have not received the clearance form.

## 2021-10-25 NOTE — Telephone Encounter (Signed)
Patient stopped by with a medical release, the form looked as if the office was requesting a clearance for a dental procedure. I spoke to [name] and she confirmed clearance is what their requesting.Maxwell Hopkins is faxing the form. I will be looking out for this.

## 2021-10-25 NOTE — Telephone Encounter (Signed)
Per Dr. Raliegh Ip ok to take an extra 1/2 of tab in the evenings. Patient notified, rx sent

## 2021-10-26 ENCOUNTER — Telehealth: Payer: Self-pay

## 2021-10-26 NOTE — Telephone Encounter (Signed)
Patient aware i've tried in multiple attempts to reach out to [employer] regarding clearance form needed to initiate clearance. Patient will call them on Monday.

## 2021-10-29 ENCOUNTER — Telehealth: Payer: Self-pay

## 2021-10-29 NOTE — Telephone Encounter (Signed)
   [facility] Pre-operative Risk Assessment    Request for surgical clearance:  What type of surgery is being performed? Dental filling  on 28-OB  When is this surgery scheduled? TBD   What type of clearance is required (medical clearance vs. Pharmacy clearance to hold med vs. Both)? Pharmacy    Are there any medications that need to be held prior to surgery and how long?Eliquis, holding length not specified    Practice name and name of physician performing surgery?  Dr. Andrez Grime at [facility]   What is your office phone number: [phone] ext [phone]    7.   What is your office fax number: [phone]  8.   Anesthesia type (None, local, MAC, general) ? Not specified    Basil Dess Sarthak Rubenstein 10/29/2021, 2:36 PM  _________________________________________________________________   (provider comments below)

## 2021-10-29 NOTE — Telephone Encounter (Signed)
   Patient Name: Maxwell Hopkins  DOB: 1946/05/28 MRN: [id number]  Primary Cardiologist: None  Chart reviewed as part of pre-operative protocol coverage.   Simple dental extractions and fillings are considered low risk procedures per guidelines and generally do not require any specific cardiac clearance. It is also generally accepted that for simple extractions, fillings, and dental cleanings, there is no need to interrupt blood thinner therapy.  SBE prophylaxis is not required for the patient from a cardiac standpoint.  I will route this recommendation to the requesting party via Epic fax function and remove from pre-op pool.  Please call with questions.  Abigail Butts, PA-C 10/29/2021, 2:56 PM

## 2021-10-31 ENCOUNTER — Telehealth: Payer: Self-pay

## 2021-10-31 NOTE — Telephone Encounter (Signed)
-----   Message from Park Liter, MD sent at 10/30/2021  2:10 PM EDT ----- Echocardiogram reviewed showing normal left ventricle ejection fraction, left ventricle hypertrophy, in the left atrium there is small tiny echodensity.  We will talk about this during the visit

## 2021-11-01 NOTE — Telephone Encounter (Signed)
Spoke with the patient results informed in [place]. Patient verbalized understanding.

## 2021-11-02 ENCOUNTER — Telehealth: Payer: Self-pay | Admitting: *Deleted

## 2021-11-02 DIAGNOSIS — I10 Essential (primary) hypertension: Secondary | ICD-10-CM | POA: Insufficient documentation

## 2021-11-02 DIAGNOSIS — E785 Hyperlipidemia, unspecified: Secondary | ICD-10-CM | POA: Diagnosis not present

## 2021-11-02 DIAGNOSIS — N529 Male erectile dysfunction, unspecified: Secondary | ICD-10-CM | POA: Insufficient documentation

## 2021-11-02 DIAGNOSIS — R7303 Prediabetes: Secondary | ICD-10-CM | POA: Diagnosis not present

## 2021-11-02 DIAGNOSIS — I471 Supraventricular tachycardia: Secondary | ICD-10-CM | POA: Insufficient documentation

## 2021-11-02 DIAGNOSIS — Z125 Encounter for screening for malignant neoplasm of prostate: Secondary | ICD-10-CM | POA: Diagnosis not present

## 2021-11-02 DIAGNOSIS — R002 Palpitations: Secondary | ICD-10-CM | POA: Insufficient documentation

## 2021-11-02 NOTE — Telephone Encounter (Signed)
Pt came in because he was told to come in 2 weeks to get labs drawn; there is nothing in the chart about what he needed. Please advise

## 2021-11-06 NOTE — Telephone Encounter (Signed)
Spoke with patient, aware no blood work is needed.

## 2021-11-07 ENCOUNTER — Ambulatory Visit (INDEPENDENT_AMBULATORY_CARE_PROVIDER_SITE_OTHER): Payer: Medicare HMO | Admitting: Cardiology

## 2021-11-07 ENCOUNTER — Encounter: Payer: Self-pay | Admitting: Cardiology

## 2021-11-07 ENCOUNTER — Ambulatory Visit: Payer: Medicare HMO | Admitting: Cardiology

## 2021-11-07 ENCOUNTER — Other Ambulatory Visit: Payer: Self-pay

## 2021-11-07 VITALS — BP 106/56 | HR 74 | Ht 66.0 in | Wt 173.4 lb

## 2021-11-07 DIAGNOSIS — E785 Hyperlipidemia, unspecified: Secondary | ICD-10-CM | POA: Diagnosis not present

## 2021-11-07 DIAGNOSIS — I10 Essential (primary) hypertension: Secondary | ICD-10-CM | POA: Diagnosis not present

## 2021-11-07 DIAGNOSIS — R002 Palpitations: Secondary | ICD-10-CM

## 2021-11-07 DIAGNOSIS — I480 Paroxysmal atrial fibrillation: Secondary | ICD-10-CM | POA: Insufficient documentation

## 2021-11-07 DIAGNOSIS — I483 Typical atrial flutter: Secondary | ICD-10-CM | POA: Diagnosis not present

## 2021-11-07 DIAGNOSIS — R931 Abnormal findings on diagnostic imaging of heart and coronary circulation: Secondary | ICD-10-CM

## 2021-11-07 HISTORY — DX: Paroxysmal atrial fibrillation: I48.0

## 2021-11-07 NOTE — Progress Notes (Signed)
Cardiology Office Note:    Date:  11/07/2021   ID:  Dalonte, Hardage 01-04-1946, MRN [id number]  PCP:  Maryella Shivers, MD  Cardiologist:  Jenne Campus, MD    Referring MD: Maryella Shivers, MD   No chief complaint on file. Doing well  History of Present Illness:    MINDY BEHNKEN is a 75 y.o. male with past medical history significant for paroxysmal atrial flutter proximal atrial fibrillation, essential hypertension, dyslipidemia, borderline diabetes.  He was referred to [place] because of episode of palpitations.  He is fine to have blood proximal mitral fibrillation as well as paroxysmal atrial flutter.  He is being anticoagulated very appropriately.  Echocardiogram has been performed showed preserved ejection fraction surprisingly there is tiny targets noted within the left atrium.  We did have a discussion today about what to do with the situation.  He did for monitor monitor show 13% burden of his atrial fibrillation/flutter.  Overall he is doing well.  Denies have any chest pain tightness squeezing pressure burning chest overall looks good.  Past Medical History:  Diagnosis Date   Atrial flutter ([place]) 10/05/2021   Atrial tachycardia ([place])    Borderline diabetes 10/05/2021   Dyslipidemia 10/05/2021   ED (erectile dysfunction)    Essential hypertension 10/05/2021   Hyperlipidemia    Hypertension    Palpitations    Prediabetes     Past Surgical History:  Procedure Laterality Date   NO PAST SURGERIES      Current Medications: Current Meds  Medication Sig   apixaban (ELIQUIS) 5 MG TABS tablet Take 1 tablet (5 mg total) by mouth 2 (two) times daily.   atorvastatin (LIPITOR) 40 MG tablet Take 40 mg by mouth daily.   fluticasone (FLONASE) 50 MCG/ACT nasal spray Place 1 spray into both nostrils daily.   metoprolol succinate (TOPROL-XL) 50 MG 24 hr tablet Take 1 tab in the morning and 1/2 tab in the evening .   tadalafil (CIALIS) 5 MG tablet Take 5 mg by mouth daily.      Allergies:   Penicillin g   Social History   Socioeconomic History   Marital status: Married    Spouse name: Not on file   Number of children: Not on file   Years of education: Not on file   Highest education level: Not on file  Occupational History   Not on file  Tobacco Use   Smoking status: Never   Smokeless tobacco: Never  Substance and Sexual Activity   Alcohol use: Not Currently   Drug use: Never   Sexual activity: Yes  Other Topics Concern   Not on file  Social History Narrative   Not on file   Social Determinants of Health   Financial Resource Strain: Not on file  Food Insecurity: Not on file  Transportation Needs: Not on file  Physical Activity: Not on file  Stress: Not on file  Social Connections: Not on file     Family History: The patient's Family history is unknown by patient. ROS:   Please see the history of present illness.    All 14 point review of systems negative except as described per history of present illness  EKGs/Labs/Other Studies Reviewed:      Recent Labs: 10/05/2021: BUN 19; Creatinine, Ser 0.89; Platelets 196; Potassium 4.7; Sodium 139 10/19/2021: Hemoglobin 11.7  Recent Lipid Panel No results found for: CHOL, TRIG, HDL, CHOLHDL, VLDL, LDLCALC, LDLDIRECT  Physical Exam:    VS:  BP (!) 106/56  Pulse 74   Ht 5\' 6"  (1.676 m)   Wt 173 lb 6.4 oz (78.7 kg)   SpO2 97%   BMI 27.99 kg/m     Wt Readings from Last 3 Encounters:  11/07/21 173 lb 6.4 oz (78.7 kg)  10/05/21 170 lb 12.8 oz (77.5 kg)  07/20/21 166 lb 2 oz (75.4 kg)     GEN:  Well nourished, well developed in no acute distress HEENT: Normal NECK: No JVD; No carotid bruits LYMPHATICS: No lymphadenopathy CARDIAC: RRR, no murmurs, no rubs, no gallops RESPIRATORY:  Clear to auscultation without rales, wheezing or rhonchi  ABDOMEN: Soft, non-tender, non-distended MUSCULOSKELETAL:  No edema; No deformity  SKIN: Warm and dry LOWER EXTREMITIES: no  swelling NEUROLOGIC:  Alert and oriented x 3 PSYCHIATRIC:  Normal affect   ASSESSMENT:    1. Typical atrial flutter ([place])   2. Abnormal echocardiogram   3. Paroxysmal atrial fibrillation ([facility])   4. Essential hypertension   5. Dyslipidemia   6. Palpitations    PLAN:    In order of problems listed above:  Typical atrial flutter/atrial fibrillation.  I think he deserves to be on antiarrhythmic medication.  I will ask him to have a stress test to see if he will be visible to be on flecainide.  If that will not be appropriate then amiodarone will be considered however he is still relatively young therefore I prefer to avoid medication with long term side effects.  He is anticoagulated which I will continue. Abnormal echocardiograms showing some tiny mass in the left atrium.  Will schedule him to have MRI of the heart to clarify that Essential hypertension, blood pressure well controlled continue present management.   Medication Adjustments/Labs and Tests Ordered: Current medicines are reviewed at length with the patient today.  Concerns regarding medicines are outlined above.  No orders of the defined types were placed in this encounter.  Medication changes: No orders of the defined types were placed in this encounter.   Signed, Park Liter, MD, [facility] 11/07/2021 10:28 AM    [place]

## 2021-11-07 NOTE — Addendum Note (Signed)
Addended by: Truddie Hidden on: 11/07/2021 10:45 AM   Modules accepted: Orders

## 2021-11-07 NOTE — Patient Instructions (Addendum)
Medication Instructions:  No medication changes. *If you need a refill on your cardiac medications before your next appointment, please call your pharmacy*   Lab Work: None ordered If you have labs (blood work) drawn today and your tests are completely normal, you will receive your results only by: [place] (if you have MyChart) OR A paper copy in the mail If you have any lab test that is abnormal or we need to change your treatment, we will call you to review the results.   Testing/Procedures: Your physician has requested that you have a lexiscan myoview. For further information please visit [email or web address]. Please follow instruction sheet, as given.  The test will take approximately 3 to 4 hours to complete; you may bring reading material.  If someone comes with you to your appointment, they will need to remain in the main lobby due to limited space in the testing area. **If you are pregnant or breastfeeding, please notify the nuclear lab prior to your appointment**  How to prepare for your Myocardial Perfusion Test: Do not eat or drink 3 hours prior to your test, except you may have water. Do not consume products containing caffeine (regular or decaffeinated) 12 hours prior to your test. (ex: coffee, chocolate, sodas, tea). Do bring a list of your current medications with you.  If not listed below, you may take your medications as normal. Do wear comfortable clothes (no dresses or overalls) and walking shoes, tennis shoes preferred (No heels or open toe shoes are allowed). Do NOT wear cologne, perfume, aftershave, or lotions (deodorant is allowed). If these instructions are not followed, your test will have to be rescheduled.  Su mdico ha solicitado que se Financial risk analyst. Para obtener ms informacin, visite [email or web address]. Siga la hoja de instrucciones, tal como se indica.  La prueba tardar aproximadamente de 3 a 4 horas en completarse; puede traer  material de lectura. Si alguien lo acompaa a su cita, Equities trader en el vestbulo principal debido al espacio limitado en el rea de prueba. **Si est embarazada o amamantando, notifique al laboratorio nuclear antes de su cita**  Cmo prepararse para su prueba de perfusin miocrdica:  No coma ni beba 3 horas antes de su prueba, excepto que tenga agua.  No consuma productos que contengan cafena (regular o descafeinada) 12 horas antes de su prueba. (Ej: caf, chocolate, refrescos, t).  "Ane Payment lista de sus medicamentos actuales. Si no se encuentran en la lista a continuacin, puede tomar sus medicamentos con normalidad".  Use ropa cmoda (sin vestidos ni overoles) y zapatos para caminar, preferiblemente tenis (no se permiten tacones ni zapatos abiertos).  NO use colonia, perfume, locin para despus del afeitado o lociones (se permite el desodorante).  Si no se siguen estas instrucciones, su prueba tendr que ser reprogramada.   Your physician has requested that you have a cardiac MRI. Cardiac MRI uses a computer to create images of your heart as its beating, producing both still and moving pictures of your heart and major blood vessels. For further information please visit [email or web address]-[email or web address] Please follow the instruction sheet given to you today for more information.   Follow-Up: At [facility], you and your health needs are our priority.  As part of our continuing mission to provide you with exceptional heart care, we have created designated Provider Care Teams.  These Care Teams include your primary Cardiologist (physician) and Advanced Practice Providers (APPs -  Physician Assistants and Nurse Practitioners) who all work together to provide you  with the care you need, when you need it.  We recommend signing up for the patient portal called "MyChart".  Sign up information is provided on this After Visit Summary.  MyChart is used to connect with patients for Virtual Visits  (Telemedicine).  Patients are able to view lab/test results, encounter notes, upcoming appointments, etc.  Non-urgent messages can be sent to your provider as well.   To learn more about what you can do with MyChart, go to [email or web address].    Your next appointment:   2 month(s)  The format for your next appointment:   In Person  Provider:   Jenne Campus, MD   Other Instructions Cardiac Nuclear Scan A cardiac nuclear scan is a test that is done to check the flow of blood to your heart. It is done when you are resting and when you are exercising. The test looks for problems such as: Not enough blood reaching a portion of the heart. The heart muscle not working as it should. You may need this test if: You have heart disease. You have had lab results that are not normal. You have had heart surgery or a balloon procedure to open up blocked arteries (angioplasty). You have chest pain. You have shortness of breath. In this test, a special dye (tracer) is put into your bloodstream. The tracer will travel to your heart. A camera will then take pictures of your heart to see how the tracer moves through your heart. This test is usually done at a hospital and takes 2-4 hours. Tell a doctor about: Any allergies you have. All medicines you are taking, including vitamins, herbs, eye drops, creams, and over-the-counter medicines. Any problems you or family members have had with anesthetic medicines. Any blood disorders you have. Any surgeries you have had. Any medical conditions you have. Whether you are pregnant or may be pregnant. What are the risks? Generally, this is a safe test. However, problems may occur, such as: Serious chest pain and heart attack. This is only a risk if the stress portion of the test is done. Rapid heartbeat. A feeling of warmth in your chest. This feeling usually does not last long. Allergic reaction to the tracer. What happens before the test? Ask your  doctor about changing or stopping your normal medicines. This is important. Follow instructions from your doctor about what you cannot eat or drink. Remove your jewelry on the day of the test. What happens during the test? An IV tube will be inserted into one of your veins. Your doctor will give you a small amount of tracer through the IV tube. You will wait for 20-40 minutes while the tracer moves through your bloodstream. Your heart will be monitored with an electrocardiogram (ECG). You will lie down on an exam table. Pictures of your heart will be taken for about 15-20 minutes. You may also have a stress test. For this test, one of these things may be done: You will be asked to exercise on a treadmill or a stationary bike. You will be given medicines that will make your heart work harder. This is done if you are unable to exercise. When blood flow to your heart has peaked, a tracer will again be given through the IV tube. After 20-40 minutes, you will get back on the exam table. More pictures will be taken of your heart. Depending on the tracer that is used, more pictures may need to be taken 3-4 hours later. Your IV tube will be removed  when the test is over. The test may vary among doctors and hospitals. What happens after the test? Ask your doctor: Whether you can return to your normal schedule, including diet, activities, and medicines. Whether you should drink more fluids. This will help to remove the tracer from your body. Drink enough fluid to keep your pee (urine) pale yellow. Ask your doctor, or the department that is doing the test: When will my results be ready? How will I get my results? Summary A cardiac nuclear scan is a test that is done to check the flow of blood to your heart. Tell your doctor whether you are pregnant or may be pregnant. Before the test, ask your doctor about changing or stopping your normal medicines. This is important. Ask your doctor whether you  can return to your normal activities. You may be asked to drink more fluids. This information is not intended to replace advice given to you by your health care provider. Make sure you discuss any questions you have with your health care provider. Document Revised: 04/07/2019 Document Reviewed: 06/01/2018 Elsevier Patient Education  2021 [place] Cardiac Nuclear Scan Dannielle Karvonen cardaca es una prueba que se realiza para verificar el flujo de sangre hacia el corazn. Se realiza cuando est en reposo y cuando hace ejercicio. La prueba puede detectar si: No llega suficiente sangre a una regin del corazn. El msculo cardaco no funciona como debera. Puede ser [employer] le hagan esta prueba si: Wilma Flavin enfermedad cardaca. Ha tenido resultados de laboratorio que no son normales. Ha tenido una ciruga cardaca o un procedimiento de baln para abrir las arterias obstruidas (angioplastia). Siente dolor en el pecho. Le falta el aire. Para esta prueba, se pone un tinte especial (marcador) en el torrente sanguneo. El Consulting civil engineer. Luego una cmara tomar imgenes del corazn para ver cmo se Investment banker, operational a travs del corazn. Generalmente, esta prueba se realiza en un hospital y [place] 2 y 4 horas. Informe al mdico acerca de: Cualquier alergia que tenga. Todos los [employer] [place], incluidos vitaminas, hierbas, gotas oftlmicas, cremas y medicamentos de venta libre. Problemas previos que usted o algn miembro de su familia hayan tenido con los anestsicos. Cualquier trastorno de la sangre que tenga. Cirugas a las que se haya sometido. Cualquier afeccin mdica que tenga. Si est embarazada o podra estarlo. Cules son los riesgos? Por lo general, se trata de una prueba segura. Sin embargo, pueden presentarse problemas, por ejemplo: Dolor intenso en el pecho e infarto de miocardio. Esto es solo un riesgo si se realiza  la parte de la prueba de esfuerzo del procedimiento. Latidos cardacos rpidos. Una sensacin de Technical brewer. Esta sensacin por lo general no dura mucho tiempo. Una reaccin alrgica al [employer]. Qu ocurre antes de esta prueba? Consulte al mdico si debe cambiar o suspender sus medicamentos habituales. Esto es importante. Siga las indicaciones del mdico respecto de lo que no puede comer o beber. [place] de la prueba. Qu ocurre durante la prueba? Le colocarn un tubo (catter) intravenoso en una de las venas. El mdico le administrar una pequea cantidad de Geneticist, molecular a travs del tubo (catter) intravenoso. Usted esperar durante 20 a 40 minutos mientras el marcador se desplaza por el torrente sanguneo. Se supervisar el corazn con un electrocardiograma (ECG). Se recostar en una camilla. Se tomarn imgenes del corazn durante alrededor de 15 a 20 minutos. Tambin pueden hacerle Ardelia Mems  ergometra. Para esta prueba, puede realizarse una de estas cosas: Se le pedir que ejercite sobre una cinta caminadora o una bicicleta fija. Le administrarn medicamentos que harn que su corazn trabaje ms. Esto se realiza si no puede hacer ejercicio. Cuando el corazn reciba el flujo mximo de [place], se volver a Environmental consultant a travs del tubo (catter) intravenoso. Despus de 20 a 40 minutos, regresar a la camilla. Se le tomarn ms imgenes del corazn. Segn el marcador utilizado, es posible que se deban tomar ms imgenes de 3 a 4 horas ms tarde. Cuando la prueba haya terminado, se retirar el tubo (catter) intravenoso. El estudio puede variar segn el mdico y el hospital. Sander Nephew sucede despus del estudio? Pregntele al mdico lo siguiente: Si puede retomar su rutina normal, incluidos la dieta, las actividades y [employer]. Si debe tomar ms lquido. Esto ayudar a Musician del cuerpo. Beba suficiente lquido para Contractor pis (la orina) de  color amarillo plido. Consulte al mdico o pregunte en el departamento donde se realiza el estudio: Cundo estarn disponibles mis resultados? Cmo obtendr mis resultados? Resumen Una gammagrafa cardaca es una prueba que se realiza para verificar el flujo de sangre hacia el corazn. Informe al mdico si est embarazada o podra estarlo. Antes de la prueba, consulte al mdico si debe cambiar o suspender sus medicamentos habituales. Esto es importante. Consulte al mdico si puede volver a sus [employer]. Es posible que le indiquen que beba ms cantidad de lquidos. Esta informacin no tiene Marine scientist el consejo del mdico. Asegrese de hacerle al mdico cualquier pregunta que tenga. Document Revised: 06/04/2021 Document Reviewed: 06/04/2021 Elsevier Patient Education  2022 [place] magntica Magnetic Resonance Imaging La resonancia magntica (RM) es un estudio indoloro que toma imgenes del interior del cuerpo. Este estudio utiliza un potente imn. No utiliza rayos X. Una RM puede mostrar ms detalles acerca de un problema mdico que otras pruebas. Informe al mdico acerca de lo siguiente: Cualquier alergia que tenga. Todos los [employer] toma. Esto incluye vitaminas, hierbas, gotas para los ojos, cremas y medicamentos de venta libre. Cirugas a las que se haya sometido. Cualquier problema mdico que tenga. Cualquier pieza de metal que tenga en el organismo. Esta puede comprender lo siguiente: Cualquier articulacin nueva, como una rodilla o cadera hecha por el hombre (artificial). Cualquier dispositivo implantado, como un marcapasos. Un implante con metal en el odo (implante coclear). Una vlvula cardaca artificial. Un objeto del ojo que tenga metal. Esquirlas de metal. Piezas de una bala. Un puerto para insulina o quimioterapia. Tatuajes. Si tiene un implante anticonceptivo, como un DIU. Si est embarazada o podra estarlo, o si  est amamantando. Cualquier temor a los espacios cerrados (claustrofobia). Es posible que le administren un medicamento para ayudarlo a Nurse, children's. Cules son los riesgos? Por lo general, se trata de un estudio seguro. Sin embargo, pueden ocurrir complicaciones. Esto incluye lo siguiente: Si tiene metal en el cuerpo cerca de la zona que se est evaluando, puede ser difcil obtener imgenes claras. Si est embarazada, debe evitar los estudios por RM, [employer] primeros tres meses de [place]. Si est amamantando y se va a usar un tinte durante la prueba, es posible que tenga que dejar de hacerlo hasta que el tinte se elimine del cuerpo. Si se [place] un tinte, existe el riesgo de [place] reaccin alrgica al tinte. Puede tomar medicamentos para prevenir esta reaccin o para tratarla si tiene sntomas. Si se  [place] un tinte, este puede causar daos en los riones. Beber mucha agua antes y despus de la prueba puede ayudar a evitar esto. Qu ocurre antes de esta prueba? Le pedirn que se quite todos los objetos de metal que tenga. Esta puede comprender lo siguiente: El reloj, joyas y otros objetos de metal. Audfonos. Dentaduras postizas. Sostn con aro. [place]. Los aparatos de ortodoncia y las emplomaduras no son un problema. Si est amamantando, pregunte al mdico si debe extraerse leche antes de la prueba. Es posible que tenga que dejar de Economist durante un tiempo si se va a usar un tinte. Qu ocurre durante la prueba?  Pueden darle auriculares o audfonos para que escuche msica. La mquina de RM puede ser ruidosa. Deber recostarse en una camilla. Si se utilizar un tinte, Firefighter un tubo (catter) intravenoso en una de las venas. Se lo administrarn a travs de un tubo (catter) intravenoso. La camilla se deslizar hacia un tnel. El tnel tiene imanes en su interior. Una vez dentro del tnel, usted podr seguir hablando con el mdico. El tnel escanear su cuerpo y crear imgenes.  Le pedirn que permanezca acostado y [place]. El mdico le dir cundo puede moverse. Cuando se hayan tomado todas las imgenes, la camilla se deslizar hacia afuera del tnel. La prueba puede tardar entre 30 minutos y ms de [place]. El estudio puede variar segn el mdico y el hospital. Qu puedo esperar despus de la prueba? Si se le administr un medicamento para ayudarlo a Nurse, children's, es posible que se lo controle hasta que abandone el hospital o la [place]. Esto incluye controlar la presin arterial, la frecuencia cardaca y Personal assistant, y el nivel de oxgeno en la sangre. Si se utiliz un tinte: Se eliminar del cuerpo a travs del pis (orina). Esto lleva aproximadamente Optician, dispensing. Es posible que le indiquen beber mucho lquido. Esto ayudar al cuerpo a eliminar el tinte. No amamante a un nio [employer] su mdico le diga que es [place]. Siga estas instrucciones en su casa: Podr volver a sus actividades normales de inmediato, o como se lo haya indicado el mdico. Es su responsabilidad retirar los resultados de la prueba. Pregunte cmo obtener sus resultados cuando estn listos. Cumpla con todas las visitas de seguimiento. Resumen La Health visitor (RM) es un estudio indoloro que toma imgenes del interior del cuerpo. El tinte se puede usar para obtener imgenes de RM que sean an ms claras. Antes de la RM, asegrese de informar a su mdico sobre cualquier objeto metlico que pueda tener en el cuerpo. Hable con el mdico sobre lo que significan los resultados de la prueba. Esta informacin no tiene Marine scientist el consejo del mdico. Asegrese de hacerle al mdico cualquier pregunta que tenga. Document Revised: 07/07/2020 Document Reviewed: 07/07/2020 Elsevier Patient Education  [place].

## 2021-11-08 ENCOUNTER — Telehealth (HOSPITAL_COMMUNITY): Payer: Self-pay | Admitting: *Deleted

## 2021-11-08 NOTE — Telephone Encounter (Signed)
Patient given detailed instructions , using the language line,per Myocardial Perfusion Study Information Sheet for the test on  11/15/21  Patient notified to arrive 15 minutes early and that it is imperative to arrive on time for appointment to keep from having the test rescheduled.  If you need to cancel or reschedule your appointment, please call the office within 24 hours of your appointment. . Patient verbalized understanding. Kirstie Peri

## 2021-11-09 DIAGNOSIS — E785 Hyperlipidemia, unspecified: Secondary | ICD-10-CM | POA: Diagnosis not present

## 2021-11-09 DIAGNOSIS — R7303 Prediabetes: Secondary | ICD-10-CM | POA: Diagnosis not present

## 2021-11-09 DIAGNOSIS — I480 Paroxysmal atrial fibrillation: Secondary | ICD-10-CM | POA: Diagnosis not present

## 2021-11-09 DIAGNOSIS — I10 Essential (primary) hypertension: Secondary | ICD-10-CM | POA: Diagnosis not present

## 2021-11-13 ENCOUNTER — Telehealth: Payer: Self-pay

## 2021-11-13 NOTE — Telephone Encounter (Signed)
Stress Test Approved per [employer]. Josem Kaufmann #[id number]. Letter of approval processed to scan

## 2021-11-14 NOTE — Addendum Note (Signed)
Addended by: Jenne Campus on: 11/14/2021 08:54 AM   Modules accepted: Orders

## 2021-11-15 ENCOUNTER — Ambulatory Visit (INDEPENDENT_AMBULATORY_CARE_PROVIDER_SITE_OTHER): Payer: Medicare HMO

## 2021-11-15 ENCOUNTER — Other Ambulatory Visit: Payer: Self-pay

## 2021-11-15 DIAGNOSIS — I483 Typical atrial flutter: Secondary | ICD-10-CM | POA: Diagnosis not present

## 2021-11-15 MED ORDER — REGADENOSON 0.4 MG/5ML IV SOLN
0.4000 mg | Freq: Once | INTRAVENOUS | Status: AC
  Administered 2021-11-15: 0.4 mg via INTRAVENOUS

## 2021-11-15 MED ORDER — TECHNETIUM TC 99M TETROFOSMIN IV KIT
26.4000 | PACK | Freq: Once | INTRAVENOUS | Status: AC | PRN
  Administered 2021-11-15: 26.4 via INTRAVENOUS

## 2021-11-15 MED ORDER — TECHNETIUM TC 99M TETROFOSMIN IV KIT
8.4000 | PACK | Freq: Once | INTRAVENOUS | Status: AC | PRN
  Administered 2021-11-15: 8.4 via INTRAVENOUS

## 2021-11-16 LAB — MYOCARDIAL PERFUSION IMAGING
LV dias vol: 71 mL (ref 62–150)
LV sys vol: 18 mL
Nuc Stress EF: 75 %
Peak HR: 85 {beats}/min
Rest HR: 65 {beats}/min
Rest Nuclear Isotope Dose: 8.4 mCi
SDS: 0
SRS: 0
SSS: 0
ST Depression (mm): 0 mm
Stress Nuclear Isotope Dose: 26.4 mCi
TID: 0.98

## 2021-11-19 ENCOUNTER — Telehealth: Payer: Self-pay

## 2021-11-19 NOTE — Telephone Encounter (Signed)
-----   Message from Park Liter, MD sent at 11/19/2021  9:58 AM EST ----- Stress test normal showing no evidence of ischemia

## 2021-11-19 NOTE — Telephone Encounter (Signed)
Patient notified of test results and verbalized understanding.   

## 2021-11-21 ENCOUNTER — Telehealth: Payer: Self-pay

## 2021-11-21 NOTE — Telephone Encounter (Signed)
Patient stopped by our office stating during the stress test he felt tingling on her front side of his teeth.At home he notice his gums where swelling but did not take any antihistamines. The next day, he had a dental work done on the front lower teeth. Since then his gums became what seem to be infected. He came by the office and his gum did appear infected with drainage. I spoke to Dr. Agustin Cree and he advised this typically does not occur with stress test meds or procedure, he should to start using wash mouth and call the dentist or PCP for further instructions. Patient  aware.

## 2021-11-29 ENCOUNTER — Telehealth: Payer: Self-pay | Admitting: Emergency Medicine

## 2021-11-29 NOTE — Telephone Encounter (Signed)
   [facility] Pre-operative Risk Assessment    Patient Name: Maxwell Hopkins  DOB: Jul 03, 1946 MRN: [id number]  HEARTCARE STAFF:  - IMPORTANT!!!!!! Under Visit Info/Reason for Call, type in Other and utilize the format Clearance MM/DD/YY or Clearance TBD. Do not use dashes or single digits. - Please review there is not already an duplicate clearance open for this procedure. - If request is for dental extraction, please clarify the # of teeth to be extracted. - If the patient is currently at the dentist's office, call Pre-Op Callback Staff (MA/nurse) to input urgent request.  - If the patient is not currently in the dentist office, please route to the Pre-Op pool.  Request for surgical clearance:  What type of surgery is being performed? Extraction tooth # 2 due to infection  When is this surgery scheduled? Not specified   What type of clearance is required (medical clearance vs. Pharmacy clearance to hold med vs. Both)? Both  Are there any medications that need to be held prior to surgery and how long? None specified but want to know if we recommend to hold any.   Practice name and name of physician performing surgery? [facility]  What is the office phone number? Not listed   7.   What is the office fax number? Not listed   8.   Anesthesia type (None, local, MAC, general) ? Not specified    Senaida Ores 11/29/2021, 5:02 PM  _________________________________________________________________   (provider comments below)

## 2021-12-03 ENCOUNTER — Telehealth: Payer: Self-pay

## 2021-12-03 NOTE — Telephone Encounter (Signed)
   [facility] Pre-operative Risk Assessment    Request for surgical clearance:  What type of surgery is being performed? Teeth Extraction   When is this surgery scheduled? TBD   What type of clearance is required (medical clearance vs. Pharmacy clearance to hold med vs. Both)? Both  Are there any medications that need to be held prior to surgery and how long? Eliquis, holding length not specified    Practice name and name of physician performing surgery? Dr. Karie Georges    What is your office phone number: [phone]    7.   What is your office fax number: [phone]  8.   Anesthesia type (None, local, MAC, general) ? Not specified    Basil Dess Marquavion Venhuizen 12/03/2021, 11:45 AM  _________________________________________________________________   (provider comments below)

## 2021-12-04 NOTE — Telephone Encounter (Signed)
   Patient Name: Maxwell Hopkins  DOB: 1946-03-07 MRN: [id number]  Primary Cardiologist: Jenne Campus, MD  Chart reviewed as part of pre-operative protocol coverage.   Dental extractions of one or two teeth are considered low risk procedures per guidelines and generally do not require any specific cardiac clearance. It is also generally accepted that for extractions of one or two teeth and dental cleanings, there is no need to interrupt blood thinner therapy.  SBE prophylaxis is not required for the patient from a cardiac standpoint.  I will route this recommendation to the requesting party via Epic fax function and remove from pre-op pool.  Please call with questions.  Ledora Bottcher, PA 12/04/2021, 9:23 PM

## 2021-12-04 NOTE — Telephone Encounter (Signed)
I s/w dental office and confirmed that only 1 tooth to be extracted under local

## 2021-12-14 DIAGNOSIS — C61 Malignant neoplasm of prostate: Secondary | ICD-10-CM | POA: Diagnosis not present

## 2021-12-14 DIAGNOSIS — R351 Nocturia: Secondary | ICD-10-CM | POA: Diagnosis not present

## 2021-12-14 DIAGNOSIS — N529 Male erectile dysfunction, unspecified: Secondary | ICD-10-CM | POA: Diagnosis not present

## 2021-12-27 ENCOUNTER — Other Ambulatory Visit: Payer: Self-pay | Admitting: Cardiology

## 2021-12-27 NOTE — Telephone Encounter (Signed)
Prescription refill request for Eliquis received. Indication: afib Last office visit: Maxwell Hopkins, 11/07/2021 Scr: 0.89, 10/05/2021 Age: 75 yo  Weight: 78.5 kg   Refill sent.

## 2022-01-08 ENCOUNTER — Encounter: Payer: Self-pay | Admitting: Cardiology

## 2022-01-08 ENCOUNTER — Ambulatory Visit (INDEPENDENT_AMBULATORY_CARE_PROVIDER_SITE_OTHER): Payer: Medicare HMO | Admitting: Cardiology

## 2022-01-08 ENCOUNTER — Other Ambulatory Visit: Payer: Self-pay

## 2022-01-08 VITALS — BP 114/72 | HR 76 | Ht 66.0 in | Wt 175.4 lb

## 2022-01-08 DIAGNOSIS — E785 Hyperlipidemia, unspecified: Secondary | ICD-10-CM

## 2022-01-08 DIAGNOSIS — I483 Typical atrial flutter: Secondary | ICD-10-CM | POA: Diagnosis not present

## 2022-01-08 DIAGNOSIS — R7303 Prediabetes: Secondary | ICD-10-CM

## 2022-01-08 DIAGNOSIS — I480 Paroxysmal atrial fibrillation: Secondary | ICD-10-CM

## 2022-01-08 MED ORDER — FLECAINIDE ACETATE 50 MG PO TABS: 50.0000 mg | ORAL_TABLET | Freq: Two times a day (BID) | ORAL | 3 refills | Status: DC

## 2022-01-08 NOTE — Patient Instructions (Signed)
Medication Instructions:  Your physician has recommended you make the following change in your medication:   START Flecainide 50 mg taking 1 tablet twice a day   *If you need a refill on your cardiac medications before your next appointment, please call your pharmacy*   Lab Work: None ordered If you have labs (blood work) drawn today and your tests are completely normal, you will receive your results only by: [place] (if you have MyChart) OR A paper copy in the mail If you have any lab test that is abnormal or we need to change your treatment, we will call you to review the results.   Testing/Procedures: Your physician recommends you have a MRI (this was ordered back in November, never scheduled)   Your physician recommends that you schedule a follow-up appointment in:  Friday, 01/11/22, as a nurse visit for a EKG (new start Flecainide)   Follow-Up: At [facility], you and your health needs are our priority.  As part of our continuing mission to provide you with exceptional heart care, we have created designated Provider Care Teams.  These Care Teams include your primary Cardiologist (physician) and Advanced Practice Providers (APPs -  Physician Assistants and Nurse Practitioners) who all work together to provide you with the care you need, when you need it.  We recommend signing up for the patient portal called "MyChart".  Sign up information is provided on this After Visit Summary.  MyChart is used to connect with patients for Virtual Visits (Telemedicine).  Patients are able to view lab/test results, encounter notes, upcoming appointments, etc.  Non-urgent messages can be sent to your provider as well.   To learn more about what you can do with MyChart, go to [email or web address].    Your next appointment:   3 month(s)  The format for your next appointment:   In Person  Provider:   Jenne Campus, MD    Other Instructions

## 2022-01-08 NOTE — Progress Notes (Signed)
Cardiology Office Note:    Date:  01/08/2022   ID:  Maxwell Hopkins, Maxwell Hopkins 1946-10-17, MRN [id number]  PCP:  Maryella Shivers, MD  Cardiologist:  Jenne Campus, MD    Referring MD: Maryella Shivers, MD   Chief Complaint  Patient presents with   Follow-up  Still have palpitations look like more in evening time.  History of Present Illness:    Maxwell Hopkins is a 76 y.o. male  . male with past medical history significant for paroxysmal atrial flutter proximal atrial fibrillation, essential hypertension, dyslipidemia, borderline diabetes.  He was referred to [place] because of episode of palpitations.  He is fine to have blood proximal mitral fibrillation as well as paroxysmal atrial flutter.  He is being anticoagulated very appropriately.  Echocardiogram has been performed showed preserved ejection fraction surprisingly there is tiny targets noted within the left atrium. Comes today to my office to discuss results of his test.  He did have a stress test which showed no evidence of ischemia we did this with preparation for antiarrhythmic therapy.  He does have also some time targeting the left atrium he supposed to have MRI for some reason did not happen we will make arrangements for the test.  Past Medical History:  Diagnosis Date   Atrial flutter ([place]) 10/05/2021   Atrial tachycardia ([place])    Borderline diabetes 10/05/2021   Dyslipidemia 10/05/2021   ED (erectile dysfunction)    Essential hypertension 10/05/2021   Hyperlipidemia    Hypertension    Palpitations    Prediabetes     Past Surgical History:  Procedure Laterality Date   NO PAST SURGERIES      Current Medications: Current Meds  Medication Sig   apixaban (ELIQUIS) 5 MG TABS tablet Take 5 mg by mouth 2 (two) times daily.   atorvastatin (LIPITOR) 40 MG tablet Take 40 mg by mouth daily.   fluticasone (FLONASE) 50 MCG/ACT nasal spray Place 1 spray into both nostrils daily.   metoprolol succinate (TOPROL-XL) 50 MG 24 hr  tablet Take 50 mg by mouth See admin instructions. 1 tab in the morning and 1/2 tan in the evenings   tadalafil (CIALIS) 5 MG tablet Take 5 mg by mouth daily.     Allergies:   Penicillin g   Social History   Socioeconomic History   Marital status: Married    Spouse name: Not on file   Number of children: Not on file   Years of education: Not on file   Highest education level: Not on file  Occupational History   Not on file  Tobacco Use   Smoking status: Never   Smokeless tobacco: Never  Substance and Sexual Activity   Alcohol use: Not Currently   Drug use: Never   Sexual activity: Yes  Other Topics Concern   Not on file  Social History Narrative   Not on file   Social Determinants of Health   Financial Resource Strain: Not on file  Food Insecurity: Not on file  Transportation Needs: Not on file  Physical Activity: Not on file  Stress: Not on file  Social Connections: Not on file     Family History: The patient's Family history is unknown by patient. ROS:   Please see the history of present illness.    All 14 point review of systems negative except as described per history of present illness  EKGs/Labs/Other Studies Reviewed:      Recent Labs: 10/05/2021: BUN 19; Creatinine, Ser 0.89; Platelets 196; Potassium  4.7; Sodium 139 10/19/2021: Hemoglobin 11.7  Recent Lipid Panel No results found for: CHOL, TRIG, HDL, CHOLHDL, VLDL, LDLCALC, LDLDIRECT  Physical Exam:    VS:  BP 114/72 (BP Location: Left Arm, Patient Position: Sitting)    Pulse 76    Ht 5\' 6"  (1.676 m)    Wt 175 lb 6.4 oz (79.6 kg)    SpO2 94%    BMI 28.31 kg/m     Wt Readings from Last 3 Encounters:  01/08/22 175 lb 6.4 oz (79.6 kg)  11/15/21 173 lb (78.5 kg)  11/07/21 173 lb 6.4 oz (78.7 kg)     GEN:  Well nourished, well developed in no acute distress HEENT: Normal NECK: No JVD; No carotid bruits LYMPHATICS: No lymphadenopathy CARDIAC: RRR, no murmurs, no rubs, no gallops RESPIRATORY:   Clear to auscultation without rales, wheezing or rhonchi  ABDOMEN: Soft, non-tender, non-distended MUSCULOSKELETAL:  No edema; No deformity  SKIN: Warm and dry LOWER EXTREMITIES: no swelling NEUROLOGIC:  Alert and oriented x 3 PSYCHIATRIC:  Normal affect   ASSESSMENT:    1. Paroxysmal atrial fibrillation (HCC)   2. Typical atrial flutter (HCC)   3. Borderline diabetes   4. Dyslipidemia    PLAN:    In order of problems listed above:  Paroxysmal atrial fibrillation/atrial flutter.  He is anticoagulant which I will continue.  We will do EKG today if EKG is fine we will initiate flecainide.  I will start 50 mg twice daily.  We will continue beta-blocker at the same time.  Then 3 days later he need to come back to have another EKG done.  I warned her about potential side effect of this medication which include palpitations dizziness or passing out. Dyslipidemia, he is LDL 100 HDL 43 this is managed by primary care physician.  He is on Lipitor 40 which is high intensity statin that I continue. Borderline diabetes we did talk about need to exercise on the regular basis his last hemoglobin A1c I see from K PN is 6.1.  We will continue monitoring. Lesion noted in the left atrium.  We will make arrangements for MRI   Medication Adjustments/Labs and Tests Ordered: Current medicines are reviewed at length with the patient today.  Concerns regarding medicines are outlined above.  No orders of the defined types were placed in this encounter.  Medication changes: No orders of the defined types were placed in this encounter.   Signed, Park Liter, MD, [facility] 01/08/2022 10:01 AM    [place]

## 2022-01-11 ENCOUNTER — Ambulatory Visit (INDEPENDENT_AMBULATORY_CARE_PROVIDER_SITE_OTHER): Payer: Medicare HMO

## 2022-01-11 ENCOUNTER — Other Ambulatory Visit: Payer: Self-pay

## 2022-01-11 VITALS — BP 98/66 | HR 103 | Ht 66.0 in | Wt 171.0 lb

## 2022-01-11 DIAGNOSIS — I480 Paroxysmal atrial fibrillation: Secondary | ICD-10-CM

## 2022-01-11 NOTE — Patient Instructions (Addendum)
Medication Instructions:  STOP: Flecainide   *If you need a refill on your cardiac medications before your next appointment, please call your pharmacy*   Lab Work: None If you have labs (blood work) drawn today and your tests are completely normal, you will receive your results only by: [place] (if you have MyChart) OR A paper copy in the mail If you have any lab test that is abnormal or we need to change your treatment, we will call you to review the results.   Testing/Procedures: None   Follow-Up: At [facility], you and your health needs are our priority.  As part of our continuing mission to provide you with exceptional heart care, we have created designated Provider Care Teams.  These Care Teams include your primary Cardiologist (physician) and Advanced Practice Providers (APPs -  Physician Assistants and Nurse Practitioners) who all work together to provide you with the care you need, when you need it.  We recommend signing up for the patient portal called "MyChart".  Sign up information is provided on this After Visit Summary.  MyChart is used to connect with patients for Virtual Visits (Telemedicine).  Patients are able to view lab/test results, encounter notes, upcoming appointments, etc.  Non-urgent messages can be sent to your provider as well.   To learn more about what you can do with MyChart, go to [email or web address].    Your next appointment:   Already scheduled  The format for your next appointment:   In Person  Provider:   Shirlee More, MD    Other Instructions

## 2022-01-14 ENCOUNTER — Ambulatory Visit (INDEPENDENT_AMBULATORY_CARE_PROVIDER_SITE_OTHER): Payer: Medicare HMO

## 2022-01-14 ENCOUNTER — Other Ambulatory Visit: Payer: Self-pay

## 2022-01-14 VITALS — BP 125/77 | HR 74 | Resp 16 | Ht 66.0 in | Wt 175.6 lb

## 2022-01-14 DIAGNOSIS — I483 Typical atrial flutter: Secondary | ICD-10-CM | POA: Diagnosis not present

## 2022-01-14 DIAGNOSIS — I480 Paroxysmal atrial fibrillation: Secondary | ICD-10-CM | POA: Diagnosis not present

## 2022-01-14 NOTE — Progress Notes (Signed)
Reason for visit: EKG   Name of MD requesting visit: Agustin Cree   H&P: atrial fib/flutter  ROS related to problem: Pt was started on Flecainide 01/08/22 and had a repeat EKG on 01/11/22 and Flecainide stopped.  Assessment and plan per MD: Agustin Cree       EKG has been sent to Dr. Agustin Cree in [facility] for review and will advise pt on medication.

## 2022-01-15 ENCOUNTER — Ambulatory Visit (HOSPITAL_COMMUNITY)
Admission: RE | Admit: 2022-01-15 | Discharge: 2022-01-15 | Disposition: A | Discharge status: Home / Self Care | Payer: Medicare HMO | Source: Ambulatory Visit | Attending: Cardiology | Admitting: Cardiology

## 2022-01-15 DIAGNOSIS — R931 Abnormal findings on diagnostic imaging of heart and coronary circulation: Secondary | ICD-10-CM | POA: Insufficient documentation

## 2022-01-15 DIAGNOSIS — R918 Other nonspecific abnormal finding of lung field: Secondary | ICD-10-CM | POA: Diagnosis not present

## 2022-01-15 MED ORDER — GADOBUTROL 1 MMOL/ML IV SOLN
7.5000 mL | Freq: Once | INTRAVENOUS | Status: AC | PRN
  Administered 2022-01-15: 7.5 mL via INTRAVENOUS

## 2022-01-17 DIAGNOSIS — Z6827 Body mass index (BMI) 27.0-27.9, adult: Secondary | ICD-10-CM | POA: Diagnosis not present

## 2022-01-17 DIAGNOSIS — I4891 Unspecified atrial fibrillation: Secondary | ICD-10-CM | POA: Diagnosis not present

## 2022-01-17 DIAGNOSIS — I700 Atherosclerosis of aorta: Secondary | ICD-10-CM | POA: Diagnosis not present

## 2022-01-17 DIAGNOSIS — Z7901 Long term (current) use of anticoagulants: Secondary | ICD-10-CM | POA: Diagnosis not present

## 2022-01-17 DIAGNOSIS — I4892 Unspecified atrial flutter: Secondary | ICD-10-CM | POA: Diagnosis not present

## 2022-01-17 DIAGNOSIS — N529 Male erectile dysfunction, unspecified: Secondary | ICD-10-CM | POA: Diagnosis not present

## 2022-01-17 DIAGNOSIS — I471 Supraventricular tachycardia: Secondary | ICD-10-CM | POA: Diagnosis not present

## 2022-01-17 DIAGNOSIS — E663 Overweight: Secondary | ICD-10-CM | POA: Diagnosis not present

## 2022-01-17 DIAGNOSIS — E785 Hyperlipidemia, unspecified: Secondary | ICD-10-CM | POA: Diagnosis not present

## 2022-01-17 DIAGNOSIS — Z008 Encounter for other general examination: Secondary | ICD-10-CM | POA: Diagnosis not present

## 2022-01-17 DIAGNOSIS — D6869 Other thrombophilia: Secondary | ICD-10-CM | POA: Diagnosis not present

## 2022-01-17 DIAGNOSIS — I10 Essential (primary) hypertension: Secondary | ICD-10-CM | POA: Diagnosis not present

## 2022-01-17 DIAGNOSIS — J309 Allergic rhinitis, unspecified: Secondary | ICD-10-CM | POA: Diagnosis not present

## 2022-01-22 ENCOUNTER — Telehealth: Payer: Self-pay

## 2022-01-22 NOTE — Telephone Encounter (Signed)
-----   Message from Park Liter, MD sent at 01/20/2022  1:48 PM EST ----- I decided but he is supposed to have MRI of his heart not MRI of his chest we will looking for lesion the left atrium.  He does have simple cyst in the right kidney.  We will talk to him about what else he needs and wants to do.

## 2022-01-25 ENCOUNTER — Other Ambulatory Visit: Payer: Self-pay

## 2022-01-25 DIAGNOSIS — I483 Typical atrial flutter: Secondary | ICD-10-CM

## 2022-01-25 DIAGNOSIS — I10 Essential (primary) hypertension: Secondary | ICD-10-CM

## 2022-01-25 DIAGNOSIS — I480 Paroxysmal atrial fibrillation: Secondary | ICD-10-CM

## 2022-01-25 DIAGNOSIS — Z79899 Other long term (current) drug therapy: Secondary | ICD-10-CM

## 2022-01-25 NOTE — Progress Notes (Signed)
Per Dr. Raliegh Ip request order for Cardiac MRI based on previous MRI.  Patient notified aware the Center will be in touch for an appt. I later called the patient again instruct the patient to come by next week for labs.

## 2022-01-28 ENCOUNTER — Other Ambulatory Visit: Payer: Self-pay

## 2022-01-28 DIAGNOSIS — I10 Essential (primary) hypertension: Secondary | ICD-10-CM | POA: Diagnosis not present

## 2022-01-28 DIAGNOSIS — Z79899 Other long term (current) drug therapy: Secondary | ICD-10-CM

## 2022-01-28 DIAGNOSIS — H8110 Benign paroxysmal vertigo, unspecified ear: Secondary | ICD-10-CM | POA: Diagnosis not present

## 2022-01-28 DIAGNOSIS — I480 Paroxysmal atrial fibrillation: Secondary | ICD-10-CM

## 2022-01-28 DIAGNOSIS — I483 Typical atrial flutter: Secondary | ICD-10-CM | POA: Diagnosis not present

## 2022-01-28 DIAGNOSIS — Z6829 Body mass index (BMI) 29.0-29.9, adult: Secondary | ICD-10-CM | POA: Diagnosis not present

## 2022-01-29 LAB — CBC
Hematocrit: 34.6 % — ABNORMAL LOW (ref 37.5–51.0)
Hemoglobin: 11.8 g/dL — ABNORMAL LOW (ref 13.0–17.7)
MCH: 33.2 pg — ABNORMAL HIGH (ref 26.6–33.0)
MCHC: 34.1 g/dL (ref 31.5–35.7)
MCV: 98 fL — ABNORMAL HIGH (ref 79–97)
Platelets: 185 10*3/uL (ref 150–450)
RBC: 3.55 x10E6/uL — ABNORMAL LOW (ref 4.14–5.80)
RDW: 12.7 % (ref 11.6–15.4)
WBC: 4.9 10*3/uL (ref 3.4–10.8)

## 2022-01-30 ENCOUNTER — Telehealth: Payer: Self-pay

## 2022-01-30 NOTE — Telephone Encounter (Signed)
Patient notified of results.

## 2022-01-30 NOTE — Telephone Encounter (Signed)
-----   Message from Park Liter, MD sent at 01/30/2022  1:26 PM EST ----- Blood count mildly anemic but stable

## 2022-02-25 ENCOUNTER — Telehealth (HOSPITAL_COMMUNITY): Payer: Self-pay | Admitting: Emergency Medicine

## 2022-02-25 NOTE — Telephone Encounter (Signed)
Reaching out to patient to offer assistance regarding upcoming cardiac imaging study; pt verbalizes understanding of appt date/time, parking situation and where to check in, pre-test NPO status and medications ordered, and verified current allergies; name and call back number provided for further questions should they arise Marchia Bond RN Navigator Cardiac Imaging [facility] [phone] office [phone] cell  Denies claustro Denies iv issues Denies metals Arrival 330

## 2022-02-26 ENCOUNTER — Ambulatory Visit (HOSPITAL_COMMUNITY)
Admission: RE | Admit: 2022-02-26 | Discharge: 2022-02-26 | Disposition: A | Discharge status: Home / Self Care | Payer: Medicare HMO | Source: Ambulatory Visit | Attending: Cardiology | Admitting: Cardiology

## 2022-02-26 ENCOUNTER — Other Ambulatory Visit: Payer: Self-pay

## 2022-02-26 DIAGNOSIS — I480 Paroxysmal atrial fibrillation: Secondary | ICD-10-CM | POA: Insufficient documentation

## 2022-02-26 DIAGNOSIS — I10 Essential (primary) hypertension: Secondary | ICD-10-CM | POA: Insufficient documentation

## 2022-02-26 DIAGNOSIS — I483 Typical atrial flutter: Secondary | ICD-10-CM | POA: Diagnosis not present

## 2022-02-26 DIAGNOSIS — Z79899 Other long term (current) drug therapy: Secondary | ICD-10-CM | POA: Diagnosis not present

## 2022-02-26 MED ORDER — GADOBUTROL 1 MMOL/ML IV SOLN
10.0000 mL | Freq: Once | INTRAVENOUS | Status: AC | PRN
  Administered 2022-02-26: 10 mL via INTRAVENOUS

## 2022-02-28 ENCOUNTER — Telehealth: Payer: Self-pay

## 2022-02-28 NOTE — Telephone Encounter (Signed)
Patient notified of results.

## 2022-02-28 NOTE — Telephone Encounter (Signed)
-----   Message from Park Liter, MD sent at 02/28/2022 10:54 AM EST ----- ?MR looks normal there is no mass noted ?

## 2022-03-08 ENCOUNTER — Telehealth: Payer: Self-pay | Admitting: Cardiology

## 2022-03-08 MED ORDER — METOPROLOL SUCCINATE ER 50 MG PO TB24: 50.0000 mg | ORAL_TABLET | ORAL | 3 refills | Status: DC

## 2022-03-08 NOTE — Telephone Encounter (Signed)
Pt states he needs a refill on *METOPROLOL* ?Pt also states he would like a larger quantity because he feels like he needs to take them more often than as suugested ? ?Please Advise the best number to reach Pt  ?([phone])  ?

## 2022-03-08 NOTE — Telephone Encounter (Signed)
Spoke with pt who reports taking Metoprolol 50 mg (1) tab in AM and 1/2 tablet in the pm as instructed.  He is requesting refill on this for a 90 day supply.  Rx sent into pt's pharmacy of choice.   ?

## 2022-04-11 ENCOUNTER — Ambulatory Visit: Payer: Medicare HMO | Admitting: Cardiology

## 2022-04-19 ENCOUNTER — Ambulatory Visit (INDEPENDENT_AMBULATORY_CARE_PROVIDER_SITE_OTHER): Payer: Medicare HMO | Admitting: Cardiology

## 2022-04-19 ENCOUNTER — Encounter: Payer: Self-pay | Admitting: Cardiology

## 2022-04-19 VITALS — BP 108/60 | HR 71 | Ht 66.0 in | Wt 174.0 lb

## 2022-04-19 DIAGNOSIS — E782 Mixed hyperlipidemia: Secondary | ICD-10-CM | POA: Diagnosis not present

## 2022-04-19 DIAGNOSIS — R7303 Prediabetes: Secondary | ICD-10-CM | POA: Diagnosis not present

## 2022-04-19 DIAGNOSIS — I10 Essential (primary) hypertension: Secondary | ICD-10-CM

## 2022-04-19 DIAGNOSIS — I480 Paroxysmal atrial fibrillation: Secondary | ICD-10-CM | POA: Diagnosis not present

## 2022-04-19 DIAGNOSIS — E785 Hyperlipidemia, unspecified: Secondary | ICD-10-CM | POA: Diagnosis not present

## 2022-04-19 NOTE — Progress Notes (Signed)
?Cardiology Office Note:   ? ?Date:  04/19/2022  ? ?ID:  Maxwell Hopkins, DOB 09/13/1946, MRN [id number] ? ?PCP:  Maryella Shivers, MD  ?Cardiologist:  Jenne Campus, MD   ? ?Referring MD: Maryella Shivers, MD  ? ?Chief Complaint  ?Patient presents with  ? Results  ?Doing well ? ?History of Present Illness:   ? ?Maxwell Hopkins is a 76 y.o. male   with past medical history significant for paroxysmal atrial flutter proximal atrial fibrillation, essential hypertension, dyslipidemia, borderline diabetes.  He was referred to [place] because of episode of palpitations.  He is fine to have blood proximal mitral fibrillation as well as paroxysmal atrial flutter.  He is being anticoagulated very appropriately.  Echocardiogram has been performed showed preserved ejection fraction surprisingly there is tiny targets noted within the left atrium. ?He is in my office today for follow-up overall doing very well denies have any palpitations trying to walk on the regular basis doing quite well from that point review.  There was some suspicion about left atrial mass however MRI has been performed which did not confirm that ? ?Past Medical History:  ?Diagnosis Date  ? Atrial flutter ([place]) 10/05/2021  ? Atrial tachycardia ([place])   ? Borderline diabetes 10/05/2021  ? Dyslipidemia 10/05/2021  ? ED (erectile dysfunction)   ? Essential hypertension 10/05/2021  ? Hyperlipidemia   ? Hypertension   ? Palpitations   ? Prediabetes   ? ? ?Past Surgical History:  ?Procedure Laterality Date  ? NO PAST SURGERIES    ? ? ?Current Medications: ?Current Meds  ?Medication Sig  ? apixaban (ELIQUIS) 5 MG TABS tablet Take 5 mg by mouth 2 (two) times daily.  ? atorvastatin (LIPITOR) 40 MG tablet Take 40 mg by mouth daily.  ? flecainide (TAMBOCOR) 50 MG tablet Take 50 mg by mouth every evening.  ? fluticasone (FLONASE) 50 MCG/ACT nasal spray Place 1 spray into both nostrils daily.  ? metoprolol succinate (TOPROL-XL) 50 MG 24 hr tablet Take 1 tablet (50 mg total)  by mouth See admin instructions. 1 tab in the morning and 1/2 tab in the evenings (Patient taking differently: Take 50 mg by mouth in the morning.)  ? tadalafil (CIALIS) 5 MG tablet Take 5 mg by mouth daily.  ?  ? ?Allergies:   Penicillin g  ? ?Social History  ? ?Socioeconomic History  ? Marital status: Married  ?  Spouse name: Not on file  ? Number of children: Not on file  ? Years of education: Not on file  ? Highest education level: Not on file  ?Occupational History  ? Not on file  ?Tobacco Use  ? Smoking status: Never  ? Smokeless tobacco: Never  ?Substance and Sexual Activity  ? Alcohol use: Not Currently  ? Drug use: Never  ? Sexual activity: Yes  ?Other Topics Concern  ? Not on file  ?Social History Narrative  ? Not on file  ? ?Social Determinants of Health  ? ?Financial Resource Strain: Not on file  ?Food Insecurity: Not on file  ?Transportation Needs: Not on file  ?Physical Activity: Not on file  ?Stress: Not on file  ?Social Connections: Not on file  ?  ? ?Family History: ?The patient's Family history is unknown by patient. ?ROS:   ?Please see the history of present illness.    ?All 14 point review of systems negative except as described per history of present illness ? ?EKGs/Labs/Other Studies Reviewed:   ? ? ? ?Recent Labs: ?10/05/2021:  BUN 19; Creatinine, Ser 0.89; Potassium 4.7; Sodium 139 ?01/28/2022: Hemoglobin 11.8; Platelets 185  ?Recent Lipid Panel ?No results found for: CHOL, TRIG, HDL, CHOLHDL, VLDL, LDLCALC, LDLDIRECT ? ?Physical Exam:   ? ?VS:  BP 108/60 (BP Location: Left Arm, Patient Position: Sitting)   Pulse 71   Ht '5\' 6"'$  (1.676 m)   Wt 174 lb (78.9 kg)   SpO2 92%   BMI 28.08 kg/m?    ? ?Wt Readings from Last 3 Encounters:  ?04/19/22 174 lb (78.9 kg)  ?01/14/22 175 lb 9.6 oz (79.7 kg)  ?01/11/22 171 lb (77.6 kg)  ?  ? ?GEN:  Well nourished, well developed in no acute distress ?HEENT: Normal ?NECK: No JVD; No carotid bruits ?LYMPHATICS: No lymphadenopathy ?CARDIAC: RRR, no murmurs, no  rubs, no gallops ?RESPIRATORY:  Clear to auscultation without rales, wheezing or rhonchi  ?ABDOMEN: Soft, non-tender, non-distended ?MUSCULOSKELETAL:  No edema; No deformity  ?SKIN: Warm and dry ?LOWER EXTREMITIES: no swelling ?NEUROLOGIC:  Alert and oriented x 3 ?PSYCHIATRIC:  Normal affect  ? ?ASSESSMENT:   ? ?1. Paroxysmal atrial fibrillation (HCC)   ?2. Primary hypertension   ?3. Mixed hyperlipidemia   ?4. Dyslipidemia   ?5. Prediabetes   ? ?PLAN:   ? ?In order of problems listed above: ? ?Paroxysmal atrial fibrillation, he is maintaining sinus rhythm on flecainide, QT interval is normal QRS complex duration normal we will continue present management which include anticoagulation ?Essential hypertension: Blood pressure well controlled continue present management. ?Dyslipidemia, I did review K PN which show me data from November of last year with LDL of 90 HDL 47.  [place] have negative stress test.  We will continue present management which include Lipitor 40. ?Overall he is doing very well continue present regimen see him back in 6 months ? ? ?Medication Adjustments/Labs and Tests Ordered: ?Current medicines are reviewed at length with the patient today.  Concerns regarding medicines are outlined above.  ?Orders Placed This Encounter  ?Procedures  ? EKG 12-Lead  ? ?Medication changes: No orders of the defined types were placed in this encounter. ? ? ?Signed, ?Park Liter, MD, [facility] ?04/19/2022 4:55 PM    ?[place] ?

## 2022-04-19 NOTE — Patient Instructions (Signed)

## 2022-05-29 DIAGNOSIS — I10 Essential (primary) hypertension: Secondary | ICD-10-CM | POA: Diagnosis not present

## 2022-05-29 DIAGNOSIS — E785 Hyperlipidemia, unspecified: Secondary | ICD-10-CM | POA: Diagnosis not present

## 2022-05-29 DIAGNOSIS — R7303 Prediabetes: Secondary | ICD-10-CM | POA: Diagnosis not present

## 2022-05-31 DIAGNOSIS — N529 Male erectile dysfunction, unspecified: Secondary | ICD-10-CM | POA: Diagnosis not present

## 2022-05-31 DIAGNOSIS — R351 Nocturia: Secondary | ICD-10-CM | POA: Diagnosis not present

## 2022-05-31 DIAGNOSIS — C61 Malignant neoplasm of prostate: Secondary | ICD-10-CM | POA: Diagnosis not present

## 2022-06-04 DIAGNOSIS — I480 Paroxysmal atrial fibrillation: Secondary | ICD-10-CM | POA: Diagnosis not present

## 2022-06-04 DIAGNOSIS — E785 Hyperlipidemia, unspecified: Secondary | ICD-10-CM | POA: Diagnosis not present

## 2022-06-04 DIAGNOSIS — I10 Essential (primary) hypertension: Secondary | ICD-10-CM | POA: Diagnosis not present

## 2022-06-04 DIAGNOSIS — Z23 Encounter for immunization: Secondary | ICD-10-CM | POA: Diagnosis not present

## 2022-06-04 DIAGNOSIS — R7303 Prediabetes: Secondary | ICD-10-CM | POA: Diagnosis not present

## 2022-06-10 ENCOUNTER — Other Ambulatory Visit: Payer: Self-pay

## 2022-06-10 ENCOUNTER — Other Ambulatory Visit: Payer: Self-pay | Admitting: Cardiology

## 2022-06-10 MED ORDER — APIXABAN 5 MG PO TABS: 5.0000 mg | ORAL_TABLET | Freq: Two times a day (BID) | ORAL | 3 refills | Status: DC

## 2022-06-10 NOTE — Telephone Encounter (Signed)
Patient is requesting to have a 90 day supply of Eliquis sent to the [facility] on [address]. CB # [phone]

## 2022-06-13 MED ORDER — APIXABAN 5 MG PO TABS: 5.0000 mg | ORAL_TABLET | Freq: Two times a day (BID) | ORAL | 3 refills | Status: DC

## 2022-06-17 ENCOUNTER — Telehealth: Payer: Self-pay

## 2022-06-17 NOTE — Telephone Encounter (Signed)
Pt came in with BP and HR  readings. He confirmed he was taking his Eliquis BID, Metoprolol 1 in AM 1/2 in PM, Flecainide '50mg'$  - unsure. His pulse rates are increased. He stated that he has sickness in his family and he is under stress. BP- 110/58, P- 64 reg, POX 98% here in office. Will discuss with Dr. Agustin Cree in the AM and call pt with further  advice. Pt agreed and verbalized understanding.

## 2022-06-18 ENCOUNTER — Telehealth: Payer: Self-pay

## 2022-06-18 NOTE — Telephone Encounter (Signed)
Per Dr. Agustin Cree after reviewing BP log, advised the patient to try 2 tab Metoprolol and continue to monitor BP. Patient notified of the following per Dr. Agustin Cree.

## 2022-06-18 NOTE — Telephone Encounter (Signed)
Medication updated

## 2022-08-20 DIAGNOSIS — Z1211 Encounter for screening for malignant neoplasm of colon: Secondary | ICD-10-CM | POA: Diagnosis not present

## 2022-08-20 DIAGNOSIS — Z0000 Encounter for general adult medical examination without abnormal findings: Secondary | ICD-10-CM | POA: Diagnosis not present

## 2022-08-20 DIAGNOSIS — Z6828 Body mass index (BMI) 28.0-28.9, adult: Secondary | ICD-10-CM | POA: Diagnosis not present

## 2022-08-20 DIAGNOSIS — Z23 Encounter for immunization: Secondary | ICD-10-CM | POA: Diagnosis not present

## 2022-09-09 DIAGNOSIS — M25521 Pain in right elbow: Secondary | ICD-10-CM | POA: Diagnosis not present

## 2022-09-09 DIAGNOSIS — W19XXXA Unspecified fall, initial encounter: Secondary | ICD-10-CM | POA: Diagnosis not present

## 2022-09-09 DIAGNOSIS — Z6828 Body mass index (BMI) 28.0-28.9, adult: Secondary | ICD-10-CM | POA: Diagnosis not present

## 2022-09-09 DIAGNOSIS — M25511 Pain in right shoulder: Secondary | ICD-10-CM | POA: Diagnosis not present

## 2022-09-13 DIAGNOSIS — Z1331 Encounter for screening for depression: Secondary | ICD-10-CM | POA: Diagnosis not present

## 2022-09-13 DIAGNOSIS — Z136 Encounter for screening for cardiovascular disorders: Secondary | ICD-10-CM | POA: Diagnosis not present

## 2022-09-13 DIAGNOSIS — Z0000 Encounter for general adult medical examination without abnormal findings: Secondary | ICD-10-CM | POA: Diagnosis not present

## 2022-09-13 DIAGNOSIS — Z1339 Encounter for screening examination for other mental health and behavioral disorders: Secondary | ICD-10-CM | POA: Diagnosis not present

## 2022-09-13 DIAGNOSIS — Z6828 Body mass index (BMI) 28.0-28.9, adult: Secondary | ICD-10-CM | POA: Diagnosis not present

## 2022-09-13 DIAGNOSIS — M25511 Pain in right shoulder: Secondary | ICD-10-CM | POA: Diagnosis not present

## 2022-09-13 DIAGNOSIS — Z139 Encounter for screening, unspecified: Secondary | ICD-10-CM | POA: Diagnosis not present

## 2022-10-04 DIAGNOSIS — Z23 Encounter for immunization: Secondary | ICD-10-CM | POA: Diagnosis not present

## 2022-10-18 ENCOUNTER — Ambulatory Visit: Payer: Medicare HMO | Attending: Cardiology | Admitting: Cardiology

## 2022-10-18 ENCOUNTER — Encounter: Payer: Self-pay | Admitting: Cardiology

## 2022-10-18 VITALS — BP 100/68 | HR 68 | Ht 66.0 in | Wt 173.4 lb

## 2022-10-18 DIAGNOSIS — R002 Palpitations: Secondary | ICD-10-CM

## 2022-10-18 DIAGNOSIS — I480 Paroxysmal atrial fibrillation: Secondary | ICD-10-CM | POA: Diagnosis not present

## 2022-10-18 DIAGNOSIS — E785 Hyperlipidemia, unspecified: Secondary | ICD-10-CM | POA: Diagnosis not present

## 2022-10-18 DIAGNOSIS — I10 Essential (primary) hypertension: Secondary | ICD-10-CM | POA: Diagnosis not present

## 2022-10-18 DIAGNOSIS — R7303 Prediabetes: Secondary | ICD-10-CM

## 2022-10-18 NOTE — Progress Notes (Unsigned)
Cardiology Office Note:    Date:  10/18/2022   ID:  Maxwell Hopkins, Maxwell Hopkins [birth year], MRN [id number]  PCP:  Maryella Shivers, MD  Cardiologist:  Jenne Campus, MD    Referring MD: Maryella Shivers, MD   Chief Complaint  Patient presents with   Follow-up  Doing well  History of Present Illness:    Maxwell Hopkins is a 76 y.o. male with past medical history significant for paroxysmal atrial fibrillation he is anticoagulated with Eliquis week continue rhythm strategy with flecainide 50 mg twice daily, essential hypertension but no actual blood pressures, pleural, dyslipidemia.  He comes today to my office for follow-up.  Overall he is doing well describing of rare episodes of short lasting palpitations denies having any chest pain tightness squeezing pressure burning chest.  Past Medical History:  Diagnosis Date   Atrial flutter ([place]) 10/05/2021   Atrial tachycardia    Borderline diabetes 10/05/2021   Dyslipidemia 10/05/2021   ED (erectile dysfunction)    Essential hypertension 10/05/2021   Hyperlipidemia    Hypertension    Palpitations    Prediabetes     Past Surgical History:  Procedure Laterality Date   NO PAST SURGERIES      Current Medications: Current Meds  Medication Sig   apixaban (ELIQUIS) 5 MG TABS tablet Take 1 tablet (5 mg total) by mouth 2 (two) times daily.   atorvastatin (LIPITOR) 40 MG tablet Take 40 mg by mouth daily.   flecainide (TAMBOCOR) 50 MG tablet Take 50 mg by mouth every evening.   fluticasone (FLONASE) 50 MCG/ACT nasal spray Place 1 spray into both nostrils daily.   metoprolol succinate (TOPROL-XL) 50 MG 24 hr tablet Take 1 tablet (50 mg total) by mouth See admin instructions. 1 tab in the morning and 1/2 tab in the evenings (Patient taking differently: Take 100 mg by mouth in the morning.)   tadalafil (CIALIS) 5 MG tablet Take 5 mg by mouth daily.     Allergies:   Penicillin g   Social History   Socioeconomic History   Marital status:  Married    Spouse name: Not on file   Number of children: Not on file   Years of education: Not on file   Highest education level: Not on file  Occupational History   Not on file  Tobacco Use   Smoking status: Never   Smokeless tobacco: Never  Substance and Sexual Activity   Alcohol use: Not Currently   Drug use: Never   Sexual activity: Yes  Other Topics Concern   Not on file  Social History Narrative   Not on file   Social Determinants of Health   Financial Resource Strain: Not on file  Food Insecurity: Not on file  Transportation Needs: Not on file  Physical Activity: Not on file  Stress: Not on file  Social Connections: Not on file     Family History: The patient's Family history is unknown by patient. ROS:   Please see the history of present illness.    All 14 point review of systems negative except as described per history of present illness  EKGs/Labs/Other Studies Reviewed:      Recent Labs: 01/28/2022: Hemoglobin 11.8; Platelets 185  Recent Lipid Panel No results found for: "CHOL", "TRIG", "HDL", "CHOLHDL", "VLDL", "LDLCALC", "LDLDIRECT"  Physical Exam:    VS:  BP 100/68 (BP Location: Left Arm, Patient Position: Sitting)   Pulse 68   Ht '5\' 6"'$  (1.676 m)   Wt 173 lb 6.4  oz (78.7 kg)   SpO2 97%   BMI 27.99 kg/m     Wt Readings from Last 3 Encounters:  10/18/22 173 lb 6.4 oz (78.7 kg)  04/19/22 174 lb (78.9 kg)  01/14/22 175 lb 9.6 oz (79.7 kg)     GEN:  Well nourished, well developed in no acute distress HEENT: Normal NECK: No JVD; No carotid bruits LYMPHATICS: No lymphadenopathy CARDIAC: RRR, no murmurs, no rubs, no gallops RESPIRATORY:  Clear to auscultation without rales, wheezing or rhonchi  ABDOMEN: Soft, non-tender, non-distended MUSCULOSKELETAL:  No edema; No deformity  SKIN: Warm and dry LOWER EXTREMITIES: no swelling NEUROLOGIC:  Alert and oriented x 3 PSYCHIATRIC:  Normal affect   ASSESSMENT:    1. Paroxysmal atrial  fibrillation (HCC)   2. Essential hypertension   3. Borderline diabetes   4. Palpitations   5. Dyslipidemia    PLAN:    In order of problems listed above:  Paroxysmal atrial fibrillation rare palpitations anticoagulated rhythm control strategy with flecainide EKG showed normal QS complex duration morphology some APCs.  Hypertension questionable diagnosis actually he does have a blood pressure being on the lower side. This did edema I did review his K PN which show me his LDL of 105 HDL 45.  He is on Lipitor 40 which I will continue for now   Medication Adjustments/Labs and Tests Ordered: Current medicines are reviewed at length with the patient today.  Concerns regarding medicines are outlined above.  Orders Placed This Encounter  Procedures   EKG 12-Lead   Medication changes: No orders of the defined types were placed in this encounter.   Signed, Park Liter, MD, [facility] 10/18/2022 3:19 PM    [place]

## 2022-10-18 NOTE — Patient Instructions (Signed)

## 2022-12-06 DIAGNOSIS — I10 Essential (primary) hypertension: Secondary | ICD-10-CM | POA: Diagnosis not present

## 2022-12-06 DIAGNOSIS — E785 Hyperlipidemia, unspecified: Secondary | ICD-10-CM | POA: Diagnosis not present

## 2022-12-06 DIAGNOSIS — R7303 Prediabetes: Secondary | ICD-10-CM | POA: Diagnosis not present

## 2022-12-09 DIAGNOSIS — R351 Nocturia: Secondary | ICD-10-CM | POA: Diagnosis not present

## 2022-12-09 DIAGNOSIS — N529 Male erectile dysfunction, unspecified: Secondary | ICD-10-CM | POA: Diagnosis not present

## 2022-12-09 DIAGNOSIS — C61 Malignant neoplasm of prostate: Secondary | ICD-10-CM | POA: Diagnosis not present

## 2022-12-13 DIAGNOSIS — E785 Hyperlipidemia, unspecified: Secondary | ICD-10-CM | POA: Diagnosis not present

## 2022-12-13 DIAGNOSIS — R7303 Prediabetes: Secondary | ICD-10-CM | POA: Diagnosis not present

## 2022-12-13 DIAGNOSIS — I10 Essential (primary) hypertension: Secondary | ICD-10-CM | POA: Diagnosis not present

## 2022-12-13 DIAGNOSIS — Z6828 Body mass index (BMI) 28.0-28.9, adult: Secondary | ICD-10-CM | POA: Diagnosis not present

## 2022-12-13 DIAGNOSIS — Z1331 Encounter for screening for depression: Secondary | ICD-10-CM | POA: Diagnosis not present

## 2022-12-13 DIAGNOSIS — Z139 Encounter for screening, unspecified: Secondary | ICD-10-CM | POA: Diagnosis not present

## 2022-12-13 DIAGNOSIS — Z23 Encounter for immunization: Secondary | ICD-10-CM | POA: Diagnosis not present

## 2022-12-25 ENCOUNTER — Other Ambulatory Visit: Payer: Self-pay

## 2022-12-25 ENCOUNTER — Telehealth: Payer: Self-pay | Admitting: Cardiology

## 2022-12-25 DIAGNOSIS — Z79899 Other long term (current) drug therapy: Secondary | ICD-10-CM

## 2022-12-25 NOTE — Telephone Encounter (Signed)
Pt states his eliquis is making him bleed too much.   Please advise  [phone]

## 2022-12-25 NOTE — Telephone Encounter (Signed)
Called the patient and informed him of Dr. Julien Nordmann recommendation below:  "Please bring him in for a Chem-7 and CBC today.  If his bleeding is worse he can stop the medication otherwise Dr. Agustin Cree will review the lab work and advise him tomorrow when he is back."  Patient was agreeable with this plan and stated that he would come in today for the blood work. He had no further questions at this time.

## 2022-12-25 NOTE — Telephone Encounter (Signed)
Called patient and he reported that he was bleeding from his gums when he brushes his teeth and also from his rectum when he has a bowel movement. He is currently taking Eliquis 5 mg BID and also has a history of A-fib. Please advise

## 2022-12-26 ENCOUNTER — Other Ambulatory Visit: Payer: Self-pay

## 2022-12-26 LAB — CBC
Hematocrit: 39.7 % (ref 37.5–51.0)
Hemoglobin: 13.4 g/dL (ref 13.0–17.7)
MCH: 33.5 pg — ABNORMAL HIGH (ref 26.6–33.0)
MCHC: 33.8 g/dL (ref 31.5–35.7)
MCV: 99 fL — ABNORMAL HIGH (ref 79–97)
Platelets: 211 10*3/uL (ref 150–450)
RBC: 4 x10E6/uL — ABNORMAL LOW (ref 4.14–5.80)
RDW: 12.5 % (ref 11.6–15.4)
WBC: 6.4 10*3/uL (ref 3.4–10.8)

## 2022-12-26 LAB — BASIC METABOLIC PANEL
BUN/Creatinine Ratio: 33 — ABNORMAL HIGH (ref 10–24)
BUN: 22 mg/dL (ref 8–27)
CO2: 21 mmol/L (ref 20–29)
Calcium: 9.3 mg/dL (ref 8.6–10.2)
Chloride: 101 mmol/L (ref 96–106)
Creatinine, Ser: 0.67 mg/dL — ABNORMAL LOW (ref 0.76–1.27)
Glucose: 110 mg/dL — ABNORMAL HIGH (ref 70–99)
Potassium: 4.4 mmol/L (ref 3.5–5.2)
Sodium: 140 mmol/L (ref 134–144)
eGFR: 98 mL/min/{1.73_m2} (ref >59)

## 2022-12-26 NOTE — Telephone Encounter (Signed)
Spoke to Dr. Agustin Cree regarding this patient bleeding from the rectum and during the brushing of his teeth. He reviewed his CBC and stated that the patient should stay on the same dose of the medication and if the bleeding gets worse then call us back. This message was relayed to the patient and he had no further questions at this time.

## 2023-01-10 DIAGNOSIS — M25511 Pain in right shoulder: Secondary | ICD-10-CM | POA: Diagnosis not present

## 2023-01-14 DIAGNOSIS — M25511 Pain in right shoulder: Secondary | ICD-10-CM | POA: Diagnosis not present

## 2023-01-14 DIAGNOSIS — R531 Weakness: Secondary | ICD-10-CM | POA: Diagnosis not present

## 2023-01-17 DIAGNOSIS — R531 Weakness: Secondary | ICD-10-CM | POA: Diagnosis not present

## 2023-01-17 DIAGNOSIS — M25511 Pain in right shoulder: Secondary | ICD-10-CM | POA: Diagnosis not present

## 2023-01-21 DIAGNOSIS — M25511 Pain in right shoulder: Secondary | ICD-10-CM | POA: Diagnosis not present

## 2023-01-21 DIAGNOSIS — R531 Weakness: Secondary | ICD-10-CM | POA: Diagnosis not present

## 2023-01-28 DIAGNOSIS — M25511 Pain in right shoulder: Secondary | ICD-10-CM | POA: Diagnosis not present

## 2023-01-28 DIAGNOSIS — Z6829 Body mass index (BMI) 29.0-29.9, adult: Secondary | ICD-10-CM | POA: Diagnosis not present

## 2023-01-31 DIAGNOSIS — R531 Weakness: Secondary | ICD-10-CM | POA: Diagnosis not present

## 2023-01-31 DIAGNOSIS — M25511 Pain in right shoulder: Secondary | ICD-10-CM | POA: Diagnosis not present

## 2023-02-04 DIAGNOSIS — R531 Weakness: Secondary | ICD-10-CM | POA: Diagnosis not present

## 2023-02-04 DIAGNOSIS — M25511 Pain in right shoulder: Secondary | ICD-10-CM | POA: Diagnosis not present

## 2023-02-07 DIAGNOSIS — R531 Weakness: Secondary | ICD-10-CM | POA: Diagnosis not present

## 2023-02-07 DIAGNOSIS — M25511 Pain in right shoulder: Secondary | ICD-10-CM | POA: Diagnosis not present

## 2023-02-11 DIAGNOSIS — M25511 Pain in right shoulder: Secondary | ICD-10-CM | POA: Diagnosis not present

## 2023-02-11 DIAGNOSIS — R531 Weakness: Secondary | ICD-10-CM | POA: Diagnosis not present

## 2023-02-18 DIAGNOSIS — M25511 Pain in right shoulder: Secondary | ICD-10-CM | POA: Diagnosis not present

## 2023-02-18 DIAGNOSIS — R531 Weakness: Secondary | ICD-10-CM | POA: Diagnosis not present

## 2023-02-26 DIAGNOSIS — M7541 Impingement syndrome of right shoulder: Secondary | ICD-10-CM | POA: Diagnosis not present

## 2023-02-26 DIAGNOSIS — M25511 Pain in right shoulder: Secondary | ICD-10-CM | POA: Diagnosis not present

## 2023-03-07 DIAGNOSIS — M25511 Pain in right shoulder: Secondary | ICD-10-CM | POA: Diagnosis not present

## 2023-03-12 DIAGNOSIS — R079 Chest pain, unspecified: Secondary | ICD-10-CM | POA: Diagnosis not present

## 2023-03-12 DIAGNOSIS — M25511 Pain in right shoulder: Secondary | ICD-10-CM | POA: Diagnosis not present

## 2023-03-12 DIAGNOSIS — Z789 Other specified health status: Secondary | ICD-10-CM | POA: Diagnosis not present

## 2023-03-12 DIAGNOSIS — Z6828 Body mass index (BMI) 28.0-28.9, adult: Secondary | ICD-10-CM | POA: Diagnosis not present

## 2023-03-14 DIAGNOSIS — M25511 Pain in right shoulder: Secondary | ICD-10-CM | POA: Diagnosis not present

## 2023-03-18 DIAGNOSIS — M25511 Pain in right shoulder: Secondary | ICD-10-CM | POA: Diagnosis not present

## 2023-03-20 DIAGNOSIS — M25511 Pain in right shoulder: Secondary | ICD-10-CM | POA: Diagnosis not present

## 2023-03-26 DIAGNOSIS — M25511 Pain in right shoulder: Secondary | ICD-10-CM | POA: Diagnosis not present

## 2023-03-27 DIAGNOSIS — M25511 Pain in right shoulder: Secondary | ICD-10-CM | POA: Diagnosis not present

## 2023-03-31 DIAGNOSIS — E785 Hyperlipidemia, unspecified: Secondary | ICD-10-CM | POA: Diagnosis not present

## 2023-03-31 DIAGNOSIS — I10 Essential (primary) hypertension: Secondary | ICD-10-CM | POA: Diagnosis not present

## 2023-04-04 DIAGNOSIS — M25511 Pain in right shoulder: Secondary | ICD-10-CM | POA: Diagnosis not present

## 2023-04-10 ENCOUNTER — Encounter: Payer: Self-pay | Admitting: Cardiology

## 2023-04-10 ENCOUNTER — Ambulatory Visit: Payer: Medicare HMO | Attending: Cardiology | Admitting: Cardiology

## 2023-04-10 ENCOUNTER — Ambulatory Visit: Payer: Medicare HMO | Attending: Cardiology

## 2023-04-10 VITALS — BP 110/70 | HR 75 | Ht 66.0 in | Wt 173.0 lb

## 2023-04-10 DIAGNOSIS — I480 Paroxysmal atrial fibrillation: Secondary | ICD-10-CM | POA: Diagnosis not present

## 2023-04-10 DIAGNOSIS — R7303 Prediabetes: Secondary | ICD-10-CM

## 2023-04-10 DIAGNOSIS — I10 Essential (primary) hypertension: Secondary | ICD-10-CM

## 2023-04-10 DIAGNOSIS — R0609 Other forms of dyspnea: Secondary | ICD-10-CM

## 2023-04-10 DIAGNOSIS — E785 Hyperlipidemia, unspecified: Secondary | ICD-10-CM

## 2023-04-10 DIAGNOSIS — G4733 Obstructive sleep apnea (adult) (pediatric): Secondary | ICD-10-CM

## 2023-04-10 HISTORY — DX: Obstructive sleep apnea (adult) (pediatric): G47.33

## 2023-04-10 NOTE — Progress Notes (Signed)
Cardiology Office Note:    Date:  04/10/2023   ID:  God, Peaches 1946-08-01, MRN [id number]  PCP:  [name], MD  Cardiologist:  [name], MD    Referring MD: [name], MD   Chief Complaint  Patient presents with   Follow-up  I have palpitations  History of Present Illness:    Maxwell Hopkins is a 77 y.o. male with past medical history significant for paroxysmal atrial fibrillation, he is anticoagulated with Eliquis and we tried to continue rhythm control strategy and it is accomplished with flecainide 50 mg twice daily, additional problem include essential hypertension, dyslipidemia.  He comes today to months for follow-up.  He said he get more palpitations right now it happened about once a week when he feels his heart speeding up somewhat irregular no shortness of breath no chest pain no sweating associated with this sensation.  Obviously he is worried about it.  He also described the fact that he snores a lot at night.  He has never been checked for sleep apnea.  Past Medical History:  Diagnosis Date   Atrial flutter 10/05/2021   Atrial tachycardia    Borderline diabetes 10/05/2021   Dyslipidemia 10/05/2021   ED (erectile dysfunction)    Essential hypertension 10/05/2021   Hyperlipidemia    Hypertension    Palpitations    Prediabetes     Past Surgical History:  Procedure Laterality Date   NO PAST SURGERIES      Current Medications: Current Meds  Medication Sig   apixaban (ELIQUIS) 5 MG TABS tablet Take 1 tablet (5 mg total) by mouth 2 (two) times daily.   atorvastatin (LIPITOR) 40 MG tablet Take 40 mg by mouth daily.   flecainide (TAMBOCOR) 50 MG tablet Take 50 mg by mouth every evening.   fluticasone (FLONASE) 50 MCG/ACT nasal spray Place 1 spray into both nostrils daily.   metoprolol succinate (TOPROL-XL) 50 MG 24 hr tablet Take 50 mg by mouth daily. Takes 25 mg in evening if needed for palpitations   tadalafil (CIALIS) 5 MG tablet  Take 5 mg by mouth daily.     Allergies:   Penicillin g   Social History   Socioeconomic History   Marital status: Married    Spouse name: Not on file   Number of children: Not on file   Years of education: Not on file   Highest education level: Not on file  Occupational History   Not on file  Tobacco Use   Smoking status: Never   Smokeless tobacco: Never  Substance and Sexual Activity   Alcohol use: Not Currently   Drug use: Never   Sexual activity: Yes  Other Topics Concern   Not on file  Social History Narrative   Not on file   Social Determinants of Health   Financial Resource Strain: Not on file  Food Insecurity: Not on file  Transportation Needs: Not on file  Physical Activity: Not on file  Stress: Not on file  Social Connections: Not on file     Family History: The patient's Family history is unknown by patient. ROS:   Please see the history of present illness.    All 14 point review of systems negative except as described per history of present illness  EKGs/Labs/Other Studies Reviewed:      Recent Labs: 12/25/2022: BUN 22; Creatinine, Ser 0.67; Hemoglobin 13.4; Platelets 211; Potassium 4.4; Sodium 140  Recent Lipid Panel No results found for: "CHOL", "TRIG", "HDL", "CHOLHDL", "  VLDL", "LDLCALC", "LDLDIRECT"  Physical Exam:    VS:  BP 110/70 (BP Location: Left Arm, Patient Position: Sitting, Cuff Size: Normal)   Pulse 75   Ht 5\' 6"  (1.676 m)   Wt 173 lb (78.5 kg)   SpO2 95%   BMI 27.92 kg/m     Wt Readings from Last 3 Encounters:  04/10/23 173 lb (78.5 kg)  10/18/22 173 lb 6.4 oz (78.7 kg)  04/19/22 174 lb (78.9 kg)     GEN:  Well nourished, well developed in no acute distress HEENT: Normal NECK: No JVD; No carotid bruits LYMPHATICS: No lymphadenopathy CARDIAC: RRR, no murmurs, no rubs, no gallops RESPIRATORY:  Clear to auscultation without rales, wheezing or rhonchi  ABDOMEN: Soft, non-tender, non-distended MUSCULOSKELETAL:  No edema;  No deformity  SKIN: Warm and dry LOWER EXTREMITIES: no swelling NEUROLOGIC:  Alert and oriented x 3 PSYCHIATRIC:  Normal affect   ASSESSMENT:    1. Paroxysmal atrial fibrillation   2. Essential hypertension   3. Dyslipidemia   4. Prediabetes   5. Borderline diabetes   6. Obstructive sleep apnea    PLAN:    In order of problems listed above:  Paroxysmal atrial fibrillation.  He does have some palpitation we will put Zio patch on him to see exactly what can of arrhythmia with dealing with. As a part of evaluation I will schedule him to have echocardiogram and the purpose of this is mostly to look at the size of the left atrium which giving idea how much of atrial fibrillation we have.  If truly he does have recurrences of atrial fibrillation that we will probably will increase dose of flecainide. Snoring at night and strongly suspect sleep apnea.  Will do sleep study. Essential hypertension blood pressure well-controlled continue present management. Dyslipidemia I did review his K PN which show me his LDL of 100 HDL 50.  Will increase dose of his Lipitor to 80.   Medication Adjustments/Labs and Tests Ordered: Current medicines are reviewed at length with the patient today.  Concerns regarding medicines are outlined above.  No orders of the defined types were placed in this encounter.  Medication changes: No orders of the defined types were placed in this encounter.   Signed, Georgeanna Leaobert J. Camila Maita, MD, [facility] 04/10/2023 1:52 PM    [facility]

## 2023-04-10 NOTE — Patient Instructions (Addendum)
Medication Instructions:  Your physician recommends that you continue on your current medications as directed. Please refer to the Current Medication list given to you today.  *If you need a refill on your cardiac medications before your next appointment, please call your pharmacy*   Lab Work: None Ordered If you have labs (blood work) drawn today and your tests are completely normal, you will receive your results only by: MyChart Message (if you have MyChart) OR A paper copy in the mail If you have any lab test that is abnormal or we need to change your treatment, we will call you to review the results.   Testing/Procedures:  Your physician has requested that you have an echocardiogram. Echocardiography is a painless test that uses sound waves to create images of your heart. It provides your doctor with information about the size and shape of your heart and how well your heart's chambers and valves are working. This procedure takes approximately one hour. There are no restrictions for this procedure. Please do NOT wear cologne, perfume, aftershave, or lotions (deodorant is allowed). Please arrive 15 minutes prior to your appointment time.   WHY IS MY DOCTOR PRESCRIBING ZIO? The Zio system is proven and trusted by physicians to detect and diagnose irregular heart rhythms -- and has been prescribed to hundreds of thousands of patients.  The FDA has cleared the Zio system to monitor for many different kinds of irregular heart rhythms. In a study, physicians were able to reach a diagnosis 90% of the time with the Zio system1.  You can wear the Zio monitor -- a small, discreet, comfortable patch -- during your normal day-to-day activity, including while you sleep, shower, and exercise, while it records every single heartbeat for analysis.  1Barrett, P., et al. Comparison of 24 Hour Holter Monitoring Versus 14 Day Novel Adhesive Patch Electrocardiographic Monitoring. American Journal of  Medicine, 2014.  ZIO VS. HOLTER MONITORING The Zio monitor can be comfortably worn for up to 14 days. Holter monitors can be worn for 24 to 48 hours, limiting the time to record any irregular heart rhythms you may have. Zio is able to capture data for the 51% of patients who have their first symptom-triggered arrhythmia after 48 hours.1  LIVE WITHOUT RESTRICTIONS The Zio ambulatory cardiac monitor is a small, unobtrusive, and water-resistant patch--you might even forget you're wearing it. The Zio monitor records and stores every beat of your heart, whether you're sleeping, working out, or showering.     Follow-Up: At [facility], you and your health needs are our priority.  As part of our continuing mission to provide you with exceptional heart care, we have created designated Provider Care Teams.  These Care Teams include your primary Cardiologist (physician) and Advanced Practice Providers (APPs -  Physician Assistants and Nurse Practitioners) who all work together to provide you with the care you need, when you need it.  We recommend signing up for the patient portal called "MyChart".  Sign up information is provided on this After Visit Summary.  MyChart is used to connect with patients for Virtual Visits (Telemedicine).  Patients are able to view lab/test results, encounter notes, upcoming appointments, etc.  Non-urgent messages can be sent to your provider as well.   To learn more about what you can do with MyChart, go to [email or web address].    Your next appointment:   3 month(s)  The format for your next appointment:   In Person  Provider:   [name], MD  Other Instructions Itamar Sleep Study- Will call when insurance authorizes

## 2023-04-15 ENCOUNTER — Telehealth: Payer: Self-pay | Admitting: *Deleted

## 2023-04-15 NOTE — Telephone Encounter (Signed)
Secure chat message sent to Lisa Harris ok to activate itamar sleep device. 

## 2023-04-15 NOTE — Telephone Encounter (Signed)
Message from Claiborne Rigg received to [employer].

## 2023-04-16 ENCOUNTER — Telehealth: Payer: Self-pay

## 2023-04-16 NOTE — Telephone Encounter (Signed)
Patient notified Maxwell Hopkins approved may start the study.

## 2023-04-21 ENCOUNTER — Encounter (INDEPENDENT_AMBULATORY_CARE_PROVIDER_SITE_OTHER): Payer: Medicare HMO | Admitting: Cardiology

## 2023-04-21 DIAGNOSIS — G4733 Obstructive sleep apnea (adult) (pediatric): Secondary | ICD-10-CM | POA: Diagnosis not present

## 2023-04-25 ENCOUNTER — Ambulatory Visit: Payer: Medicare HMO | Attending: Cardiology

## 2023-04-25 DIAGNOSIS — G4733 Obstructive sleep apnea (adult) (pediatric): Secondary | ICD-10-CM

## 2023-04-25 DIAGNOSIS — R0609 Other forms of dyspnea: Secondary | ICD-10-CM

## 2023-04-25 LAB — ECHOCARDIOGRAM COMPLETE
Area-P 1/2: 3.6 cm2
S' Lateral: 2 cm

## 2023-04-25 NOTE — Procedures (Signed)
SLEEP STUDY REPORT Patient Information Study Date: 04/21/2023 Patient Name: Maxwell Hopkins Patient ID: [id number] Birth Date: [birth year] Age: 77 Gender: Male BMI: 28.0 (W=174 lb, H=5' 6'') Referring Physician: [name], MD  TEST DESCRIPTION:  Home sleep apnea testing was completed using the WatchPat, a Type 1 device, utilizing peripheral arterial tonometry (PAT), chest movement, actigraphy, pulse oximetry, pulse rate, body position and snore.  AHI was calculated with apnea and hypopnea using valid sleep time as the denominator. RDI includes apneas, hypopneas, and RERAs.  The data acquired and the scoring of sleep and all associated events were performed in accordance with the recommended standards and specifications as outlined in the AASM Manual for the Scoring of Sleep and Associated Events 2.2.0 (2015).  FINDINGS:  1.  Moderate Obstructive Sleep Apnea with AHI19.5 /hr.   2.  No Central Sleep Apnea with pAHIc 2.1/hr.  3.  Oxygen desaturations as low as 82%.  4.  Moderate snoring was present. O2 sats were < 88% for 7.4 min.  5.  Total sleep time was 6 hrs and 6 min.  6.  31.1% of total sleep time was spent in REM sleep.   7.  Normal sleep onset latency at 10 min  8.  Shortened REM sleep onset latency at 34 min.   9.  Total awakenings were 8.  10. Arrhythmia detection: None  DIAGNOSIS:   Moderate Obstructive Sleep Apnea (G47.33)  RECOMMENDATIONS:   1.  Clinical correlation of these findings is necessary.  The decision to treat obstructive sleep apnea (OSA) is usually based on the presence of apnea symptoms or the presence of associated medical conditions such as Hypertension, Congestive Heart Failure, Atrial Fibrillation or Obesity.  The most common symptoms of OSA are snoring, gasping for breath while sleeping, daytime sleepiness and fatigue.   2.  Initiating apnea therapy is recommended given the presence of symptoms and/or associated conditions. Recommend  proceeding with one of the following:     a.  Auto-CPAP therapy with a pressure range of 5-20cm H2O.     b.  An oral appliance (OA) that can be obtained from certain dentists with expertise in sleep medicine.  These are primarily of use in non-obese patients with mild and moderate disease.     c.  An ENT consultation which may be useful to look for specific causes of obstruction and possible treatment options.     d.  If patient is intolerant to PAP therapy, consider referral to ENT for evaluation for hypoglossal nerve stimulator.   3.  Close follow-up is necessary to ensure success with CPAP or oral appliance therapy for maximum benefit.  4.  A follow-up oximetry study on CPAP is recommended to assess the adequacy of therapy and determine the need for supplemental oxygen or the potential need for Bi-level therapy.  An arterial blood gas to determine the adequacy of baseline ventilation and oxygenation should also be considered.  5.  Healthy sleep recommendations include:  adequate nightly sleep (normal 7-9 hrs/night), avoidance of caffeine after noon and alcohol near bedtime, and maintaining a sleep environment that is cool, dark and quiet.  6.  Weight loss for overweight patients is recommended.  Even modest amounts of weight loss can significantly improve the severity of sleep apnea.  7.  Snoring recommendations include:  weight loss where appropriate, side sleeping, and avoidance of alcohol before bed.  8.  Operation of motor vehicle should not be performed when sleepy.  Signature:  Armanda Magic, MD; [facility]; Diplomat, American Board of Sleep Medicine Electronically Signed: 04/25/2023 12:24:01 PM

## 2023-04-30 DIAGNOSIS — E785 Hyperlipidemia, unspecified: Secondary | ICD-10-CM | POA: Diagnosis not present

## 2023-04-30 DIAGNOSIS — I10 Essential (primary) hypertension: Secondary | ICD-10-CM | POA: Diagnosis not present

## 2023-05-01 DIAGNOSIS — I480 Paroxysmal atrial fibrillation: Secondary | ICD-10-CM | POA: Diagnosis not present

## 2023-05-07 ENCOUNTER — Telehealth: Payer: Self-pay | Admitting: *Deleted

## 2023-05-07 ENCOUNTER — Telehealth: Payer: Self-pay | Admitting: Cardiology

## 2023-05-07 DIAGNOSIS — I10 Essential (primary) hypertension: Secondary | ICD-10-CM

## 2023-05-07 DIAGNOSIS — G4733 Obstructive sleep apnea (adult) (pediatric): Secondary | ICD-10-CM

## 2023-05-07 NOTE — Telephone Encounter (Signed)
-----   Message from Gaynelle Cage, [place] sent at 05/01/2023 10:34 AM EDT -----  ----- Message ----- From: Quintella Reichert, MD Sent: 04/25/2023  12:26 PM EDT To: [facility] Sleep Studies  Please let patient know that they have sleep apnea and recommend treating with CPAP.  Please order an auto CPAP from 4-15cm H2O with heated humidity and mask of choice.  Order overnight pulse ox on CPAP.  Followup with me in 6 weeks.

## 2023-05-07 NOTE — Telephone Encounter (Signed)
The patient has been notified of the result and verbalized understanding.  All questions (if any) were answered. Latrelle Dodrill, CMA 05/07/2023 3:16 PM    Upon patient request DME selection is [facility]. Patient understands he will be contacted by [facility] to set up his cpap. Patient understands to call if [facility] does not contact him with new setup in a timely manner. Patient understands they will be called once confirmation has been received from [facility] that they have received their new machine to schedule 10 week follow up appointment.   [facility] notified of new cpap order  Please add to airview Patient was grateful for the call and thanked me.

## 2023-05-07 NOTE — Telephone Encounter (Signed)
Please return the call and use the translator.  Pt aware that someone will call.

## 2023-05-07 NOTE — Telephone Encounter (Signed)
Patient's friend Elberta Fortis is calling on the behalf of the patient. Elberta Fortis is calling because the patient spoke with the nurse today and since the conversation was in english without an interpretor, the patient did not understand completely what the conversation was about. Elberta Fortis is requesting that we go over the results with him and the information regarding the patient getting his CPAP machine from [facility]. Please advise.

## 2023-05-09 ENCOUNTER — Telehealth: Payer: Self-pay

## 2023-05-09 NOTE — Telephone Encounter (Signed)
Patient notified of results.

## 2023-05-09 NOTE — Telephone Encounter (Signed)
-----   Message from Heywood Bene, [place] sent at 05/08/2023  5:31 PM EDT -----  ----- Message ----- From: Georgeanna Lea, MD Sent: 05/01/2023   8:25 AM EDT To: [name], RN  Echocardiogram showed preserved left ventricle ejection fraction with trivial mitral valve regurgitation overall looks good

## 2023-05-13 NOTE — Telephone Encounter (Signed)
Per patient it is ok to speak to Alijandro. The patient has been notified of the result and Alijandro verbalized understanding.  All questions (if any) were answered. Latrelle Dodrill, CMA 05/13/2023 5:11 PM    Upon patient request DME selection is [facility]. Patient understands he will be contacted by [facility] to set up his cpap. Patient understands to call if [facility] does not contact him with new setup in a timely manner. Patient understands they will be called once confirmation has been received from [facility] that they have received their new machine to schedule 10 week follow up appointment.   [facility] notified of new cpap order  Please add to airview Patient was grateful for the call and thanked me.

## 2023-05-21 DIAGNOSIS — G4733 Obstructive sleep apnea (adult) (pediatric): Secondary | ICD-10-CM | POA: Diagnosis not present

## 2023-05-27 DIAGNOSIS — H5203 Hypermetropia, bilateral: Secondary | ICD-10-CM | POA: Diagnosis not present

## 2023-05-28 ENCOUNTER — Telehealth: Payer: Self-pay

## 2023-05-28 DIAGNOSIS — I4891 Unspecified atrial fibrillation: Secondary | ICD-10-CM

## 2023-05-28 NOTE — Telephone Encounter (Signed)
-----   Message from Georgeanna Lea, MD sent at 05/20/2023 12:38 PM EDT ----- Multiple episode of atrial flutter/atrial fibrillation, please make referral to Dr. Elberta Fortis to talk about a more advanced way to treat this

## 2023-05-28 NOTE — Telephone Encounter (Signed)
Patient notified of results and recommendations and agreed with plan, referral on file with instructions need interpreter.

## 2023-05-31 DIAGNOSIS — I10 Essential (primary) hypertension: Secondary | ICD-10-CM | POA: Diagnosis not present

## 2023-05-31 DIAGNOSIS — E785 Hyperlipidemia, unspecified: Secondary | ICD-10-CM | POA: Diagnosis not present

## 2023-06-09 ENCOUNTER — Ambulatory Visit: Payer: Medicare HMO | Admitting: Podiatry

## 2023-06-13 DIAGNOSIS — I10 Essential (primary) hypertension: Secondary | ICD-10-CM | POA: Diagnosis not present

## 2023-06-13 DIAGNOSIS — R351 Nocturia: Secondary | ICD-10-CM | POA: Diagnosis not present

## 2023-06-13 DIAGNOSIS — R7303 Prediabetes: Secondary | ICD-10-CM | POA: Diagnosis not present

## 2023-06-13 DIAGNOSIS — C61 Malignant neoplasm of prostate: Secondary | ICD-10-CM | POA: Diagnosis not present

## 2023-06-13 DIAGNOSIS — N529 Male erectile dysfunction, unspecified: Secondary | ICD-10-CM | POA: Diagnosis not present

## 2023-06-13 DIAGNOSIS — E785 Hyperlipidemia, unspecified: Secondary | ICD-10-CM | POA: Diagnosis not present

## 2023-06-16 ENCOUNTER — Ambulatory Visit (INDEPENDENT_AMBULATORY_CARE_PROVIDER_SITE_OTHER): Payer: Medicare HMO | Admitting: Podiatry

## 2023-06-16 DIAGNOSIS — L600 Ingrowing nail: Secondary | ICD-10-CM | POA: Diagnosis not present

## 2023-06-16 DIAGNOSIS — B351 Tinea unguium: Secondary | ICD-10-CM | POA: Diagnosis not present

## 2023-06-16 MED ORDER — CICLOPIROX 8 % EX SOLN: Freq: Every day | CUTANEOUS | 0 refills | Status: DC

## 2023-06-16 NOTE — Patient Instructions (Signed)

## 2023-06-16 NOTE — Progress Notes (Signed)
  Subjective:  Patient ID: Maxwell Hopkins, male    DOB: February 16, 1946,  MRN: [id number]  Chief Complaint  Patient presents with   Nail Problem    Nail fungus to left hallux. Patient has tried using topical solution with no relief.     77 y.o. male presents with concern for nail fungal infection of the left great toe worse than right.  He has tried using topical solution in the past.  But has not helped.  He is interested in other options.  He does take Eliquis.  Past Medical History:  Diagnosis Date   Atrial flutter (HCC) 10/05/2021   Atrial tachycardia    Borderline diabetes 10/05/2021   Dyslipidemia 10/05/2021   ED (erectile dysfunction)    Essential hypertension 10/05/2021   Hyperlipidemia    Hypertension    Palpitations    Prediabetes     Allergies  Allergen Reactions   Penicillin G     ROS: Negative except as per HPI above  Objective:  General: AAO x3, NAD  Dermatological: Thickening dystrophy yellow discoloration of the left hallux nail.  Right hallux and also with some ridging and discoloration however not as pronounced as the left mild pain at the left hallux nail due to dystrophy.  Vascular:  Dorsalis Pedis artery and Posterior Tibial artery pedal pulses are 2/4 bilateral.  Capillary fill time < 3 sec to all digits.   Neruologic: Grossly intact via light touch bilateral. Protective threshold intact to all sites bilateral.   Musculoskeletal: No gross boney pedal deformities bilateral. No pain, crepitus, or limitation noted with foot and ankle range of motion bilateral. Muscular strength 5/5 in all groups tested bilateral.  Gait: Unassisted, Nonantalgic.   No images are attached to the encounter.  Assessment:   1. Ingrown left greater toenail   2. Onychomycosis      Plan:  Patient was evaluated and treated and all questions answered.    Ingrown Nail, left -Patient elects to proceed with minor surgery to remove ingrown toenail today. Consent reviewed and  signed by patient. -Ingrown nail excised. See procedure note. -Educated on post-procedure care including soaking. Written instructions provided and reviewed. -Patient elects for avulsion procedure to the left hallux nail.  Discussed that if the nail grows back the same we will have to consider permanent removal in the future -Patient to follow up in 2 weeks for nail check. -eRx for Penlac 8% topical solution to be applied to the right hallux nail once daily on 9  Procedure: Excision of Ingrown Toenail Location: Left 1st toe total avulsion Anesthesia: Lidocaine 1% plain; 1.5 mL and Marcaine 0.5% plain; 1.5 mL, digital block. Skin Prep: Betadine. Dressing: Silvadene; telfa; dry, sterile, compression dressing. Technique: Following skin prep, the toe was exsanguinated and a tourniquet was secured at the base of the toe. The affected nail border was freed, split with a nail splitter, and excised.  Nail irrigated out with alcohol. The tourniquet was then removed and sterile dressing applied. Disposition: Patient tolerated procedure well. Patient to return in 2 weeks for follow-up.    Return in about 2 weeks (around 06/30/2023) for nail check.          Corinna Gab, DPM [facility] / [facility]

## 2023-06-20 DIAGNOSIS — E785 Hyperlipidemia, unspecified: Secondary | ICD-10-CM | POA: Diagnosis not present

## 2023-06-20 DIAGNOSIS — Z6829 Body mass index (BMI) 29.0-29.9, adult: Secondary | ICD-10-CM | POA: Diagnosis not present

## 2023-06-20 DIAGNOSIS — Z1211 Encounter for screening for malignant neoplasm of colon: Secondary | ICD-10-CM | POA: Diagnosis not present

## 2023-06-20 DIAGNOSIS — I10 Essential (primary) hypertension: Secondary | ICD-10-CM | POA: Diagnosis not present

## 2023-06-20 DIAGNOSIS — R7303 Prediabetes: Secondary | ICD-10-CM | POA: Diagnosis not present

## 2023-06-21 DIAGNOSIS — G4733 Obstructive sleep apnea (adult) (pediatric): Secondary | ICD-10-CM | POA: Diagnosis not present

## 2023-06-30 ENCOUNTER — Ambulatory Visit (INDEPENDENT_AMBULATORY_CARE_PROVIDER_SITE_OTHER): Payer: Medicare HMO | Admitting: Podiatry

## 2023-06-30 DIAGNOSIS — E785 Hyperlipidemia, unspecified: Secondary | ICD-10-CM | POA: Diagnosis not present

## 2023-06-30 DIAGNOSIS — B351 Tinea unguium: Secondary | ICD-10-CM

## 2023-06-30 DIAGNOSIS — L600 Ingrowing nail: Secondary | ICD-10-CM | POA: Diagnosis not present

## 2023-06-30 DIAGNOSIS — I10 Essential (primary) hypertension: Secondary | ICD-10-CM | POA: Diagnosis not present

## 2023-06-30 NOTE — Progress Notes (Signed)
Subjective: Maxwell Hopkins is a 77 y.o.  male returns to office today for follow up evaluation after having left Hallux total nail ingrown removal total avulsion  approximately 2 weeks ago. Patient has been soaking using epsom salts and applying topical antibiotic covered with bandaid daily. Patient denies fevers, chills, nausea, vomiting. Denies any calf pain, chest pain, SOB.   Objective:  Vitals: Reviewed  General: Well developed, nourished, in no acute distress, alert and oriented x3   Dermatology: Skin is warm, dry and supple bilateral. left hallux nail bed appears to be clean, dry, with mild granular tissue and surrounding scab. There is no surrounding erythema, edema, drainage/purulence. The remaining nails appear unremarkable at this time. There are no other lesions or other signs of infection present.  Neurovascular status: Intact. No lower extremity swelling; No pain with calf compression bilateral.  Musculoskeletal: Decreased tenderness to palpation of the left hallux nail bed. Muscular strength within normal limits bilateral.   Assesement and Plan: S/p total nail avulsion  to the  left hallux nail , doing well.   -Continue soaking in epsom salts twice a day followed by antibiotic ointment and a band-aid. Can leave uncovered at night. Continue this until completely healed.  -If the area has not healed in 2 weeks, call the office for follow-up appointment, or sooner if any problems arise.  -Monitor for any signs/symptoms of infection. Call the office immediately if any occur or go directly to the emergency room. Call with any questions/concerns.        Corinna Gab, DPM [facility] / [facility]                   06/30/2023

## 2023-07-13 ENCOUNTER — Other Ambulatory Visit: Payer: Self-pay | Admitting: Cardiology

## 2023-07-13 DIAGNOSIS — I4891 Unspecified atrial fibrillation: Secondary | ICD-10-CM

## 2023-07-14 NOTE — Telephone Encounter (Signed)
Prescription refill request for Eliquis received. Indication:Afib  Last office visit: 04/10/23 Bing Matter)  Scr: 0.67 (12/25/22)  Age: 77 Weight: 78.5kg  Appropriate dose. Refill sent.

## 2023-07-15 ENCOUNTER — Ambulatory Visit: Payer: Medicare HMO | Attending: Cardiology | Admitting: Cardiology

## 2023-07-15 ENCOUNTER — Encounter: Payer: Self-pay | Admitting: Cardiology

## 2023-07-15 VITALS — BP 116/78 | HR 74 | Ht 66.0 in | Wt 175.6 lb

## 2023-07-15 DIAGNOSIS — E785 Hyperlipidemia, unspecified: Secondary | ICD-10-CM | POA: Diagnosis not present

## 2023-07-15 DIAGNOSIS — I10 Essential (primary) hypertension: Secondary | ICD-10-CM

## 2023-07-15 DIAGNOSIS — E782 Mixed hyperlipidemia: Secondary | ICD-10-CM | POA: Diagnosis not present

## 2023-07-15 DIAGNOSIS — I4891 Unspecified atrial fibrillation: Secondary | ICD-10-CM | POA: Diagnosis not present

## 2023-07-15 DIAGNOSIS — R7303 Prediabetes: Secondary | ICD-10-CM

## 2023-07-15 DIAGNOSIS — G4733 Obstructive sleep apnea (adult) (pediatric): Secondary | ICD-10-CM | POA: Diagnosis not present

## 2023-07-15 DIAGNOSIS — I480 Paroxysmal atrial fibrillation: Secondary | ICD-10-CM | POA: Diagnosis not present

## 2023-07-15 NOTE — Progress Notes (Signed)
Cardiology Office Note:    Date:  07/15/2023   ID:  Maxwell Hopkins, Maxwell Hopkins 04-09-1946, MRN [id number]  PCP:  [name], MD  Cardiologist:  [name], MD    Referring MD: [name], MD   Chief Complaint  Patient presents with   CPAP eval needed     For continuation of coverage     History of Present Illness:    Maxwell Hopkins is a 77 y.o. male past medical history significant for paroxysmal atrial fibrillation, he is anticoag with Eliquis.  He is in rhythm control strategy this is accomplished with flecainide 50 mg twice daily additional problem include essential hypertension, dyslipidemia, recently recognized moderate obstructive sleep apnea.  Sadly in spite of antiarrhythmic medications his last monitor showed relatively high burden of arrhythmia with multiple episode of atrial fibrillation/flutter total burden of 35%.  He also got 2 pauses longer than 3.1 seconds.  Asymptomatic.  Also recently he was diagnosed with obstructive sleep apnea moderate in degree and he is being treated appropriately with CPAP mask.  He use it on the regular basis and he feeling better.  He does not feel palpitations so he cannot tell if proper management of sleep apnea decrease frequency of his episode of atrial fibrillation.  But overall doing well denies have any chest pain tightness squeezing pressure burning chest.  Past Medical History:  Diagnosis Date   Atrial flutter (HCC) 10/05/2021   Atrial tachycardia    Borderline diabetes 10/05/2021   Dyslipidemia 10/05/2021   ED (erectile dysfunction)    Essential hypertension 10/05/2021   Hyperlipidemia    Hypertension    Palpitations    Prediabetes     Past Surgical History:  Procedure Laterality Date   NO PAST SURGERIES      Current Medications: Current Meds  Medication Sig   apixaban (ELIQUIS) 5 MG TABS tablet TAKE 1 TABLET BY MOUTH TWICE A DAY   atorvastatin (LIPITOR) 40 MG tablet Take 40 mg by mouth daily.   flecainide  (TAMBOCOR) 50 MG tablet TAKE 1 TABLET BY MOUTH TWICE A DAY   fluticasone (FLONASE) 50 MCG/ACT nasal spray Place 1 spray into both nostrils daily.   metoprolol succinate (TOPROL-XL) 50 MG 24 hr tablet TAKE 1 TABLET (50 MG TOTAL) BY MOUTH SEE ADMIN INSTRUCTIONS. 1 TAB IN THE MORNING AND 1/2 TAB IN THE EVENINGS (Patient taking differently: Take 50 mg by mouth daily.)   tadalafil (CIALIS) 5 MG tablet Take 5 mg by mouth daily.     Allergies:   Penicillin g   Social History   Socioeconomic History   Marital status: Married    Spouse name: Not on file   Number of children: Not on file   Years of education: Not on file   Highest education level: Not on file  Occupational History   Not on file  Tobacco Use   Smoking status: Never   Smokeless tobacco: Never  Substance and Sexual Activity   Alcohol use: Not Currently   Drug use: Never   Sexual activity: Yes  Other Topics Concern   Not on file  Social History Narrative   Not on file   Social Determinants of Health   Financial Resource Strain: Not on file  Food Insecurity: Not on file  Transportation Needs: Not on file  Physical Activity: Not on file  Stress: Not on file  Social Connections: Not on file     Family History: The patient's Family history is unknown by patient. ROS:  Please see the history of present illness.    All 14 point review of systems negative except as described per history of present illness  EKGs/Labs/Other Studies Reviewed:    EKG Interpretation Date/Time:  Tuesday July 15 2023 09:06:25 EDT Ventricular Rate:  75 PR Interval:  186 QRS Duration:  82 QT Interval:  388 QTC Calculation: 433 R Axis:   -26  Text Interpretation: Normal sinus rhythm Biatrial enlargement Abnormal ECG No previous ECGs available Confirmed by [name] [phone]) on 07/15/2023 9:09:14 AM    Recent Labs: 12/25/2022: BUN 22; Creatinine, Ser 0.67; Hemoglobin 13.4; Platelets 211; Potassium 4.4; Sodium 140  Recent Lipid  Panel No results found for: "CHOL", "TRIG", "HDL", "CHOLHDL", "VLDL", "LDLCALC", "LDLDIRECT"  Physical Exam:    VS:  BP 116/78 (BP Location: Left Arm, Patient Position: Sitting)   Pulse 74   Ht 5\' 6"  (1.676 m)   Wt 175 lb 9.6 oz (79.7 kg)   SpO2 96%   BMI 28.34 kg/m     Wt Readings from Last 3 Encounters:  07/15/23 175 lb 9.6 oz (79.7 kg)  04/10/23 173 lb (78.5 kg)  10/18/22 173 lb 6.4 oz (78.7 kg)     GEN:  Well nourished, well developed in no acute distress HEENT: Normal NECK: No JVD; No carotid bruits LYMPHATICS: No lymphadenopathy CARDIAC: RRR, no murmurs, no rubs, no gallops RESPIRATORY:  Clear to auscultation without rales, wheezing or rhonchi  ABDOMEN: Soft, non-tender, non-distended MUSCULOSKELETAL:  No edema; No deformity  SKIN: Warm and dry LOWER EXTREMITIES: no swelling NEUROLOGIC:  Alert and oriented x 3 PSYCHIATRIC:  Normal affect   ASSESSMENT:    1. Atrial fibrillation, unspecified type (HCC)   2. Paroxysmal atrial fibrillation (HCC)   3. Essential hypertension   4. Obstructive sleep apnea   5. Dyslipidemia   6. Mixed hyperlipidemia   7. Borderline diabetes    PLAN:    In order of problems listed above:  Paroxysmal atrial fibrillation total burden of 35% based on last monitor.  He already gets some pauses.  He is waiting for appointment with our EP team with consideration of more advanced management of his atrial fibrillation including potential atrial fibrillation ablation.  In the meantime we will continue flecainide at the present dose as well as anticoagulation. Essential hypertension blood pressure well-controlled continue present management. Dyslipidemia I did review K PN from June of this year with LDL of 98 HDL 46.  Continue with Lipitor 40. Borderline diabetes that being followed by internal medicine team last hemoglobin A1c from June is 6.2.   Medication Adjustments/Labs and Tests Ordered: Current medicines are reviewed at length with the  patient today.  Concerns regarding medicines are outlined above.  Orders Placed This Encounter  Procedures   EKG 12-Lead   Medication changes: No orders of the defined types were placed in this encounter.   Signed, Georgeanna Lea, MD, [facility] 07/15/2023 9:19 AM    [facility]

## 2023-07-15 NOTE — Patient Instructions (Signed)

## 2023-07-21 DIAGNOSIS — G4733 Obstructive sleep apnea (adult) (pediatric): Secondary | ICD-10-CM | POA: Diagnosis not present

## 2023-07-22 DIAGNOSIS — G4733 Obstructive sleep apnea (adult) (pediatric): Secondary | ICD-10-CM | POA: Diagnosis not present

## 2023-08-01 DIAGNOSIS — I4892 Unspecified atrial flutter: Secondary | ICD-10-CM | POA: Diagnosis not present

## 2023-08-01 DIAGNOSIS — Z79899 Other long term (current) drug therapy: Secondary | ICD-10-CM | POA: Diagnosis not present

## 2023-08-01 DIAGNOSIS — Z809 Family history of malignant neoplasm, unspecified: Secondary | ICD-10-CM | POA: Diagnosis not present

## 2023-08-01 DIAGNOSIS — G4733 Obstructive sleep apnea (adult) (pediatric): Secondary | ICD-10-CM | POA: Diagnosis not present

## 2023-08-01 DIAGNOSIS — I471 Supraventricular tachycardia, unspecified: Secondary | ICD-10-CM | POA: Diagnosis not present

## 2023-08-01 DIAGNOSIS — E785 Hyperlipidemia, unspecified: Secondary | ICD-10-CM | POA: Diagnosis not present

## 2023-08-01 DIAGNOSIS — J301 Allergic rhinitis due to pollen: Secondary | ICD-10-CM | POA: Diagnosis not present

## 2023-08-01 DIAGNOSIS — N400 Enlarged prostate without lower urinary tract symptoms: Secondary | ICD-10-CM | POA: Diagnosis not present

## 2023-08-01 DIAGNOSIS — D6869 Other thrombophilia: Secondary | ICD-10-CM | POA: Diagnosis not present

## 2023-08-01 DIAGNOSIS — I4891 Unspecified atrial fibrillation: Secondary | ICD-10-CM | POA: Diagnosis not present

## 2023-08-01 DIAGNOSIS — N529 Male erectile dysfunction, unspecified: Secondary | ICD-10-CM | POA: Diagnosis not present

## 2023-08-01 DIAGNOSIS — H269 Unspecified cataract: Secondary | ICD-10-CM | POA: Diagnosis not present

## 2023-08-04 ENCOUNTER — Ambulatory Visit: Payer: Medicare HMO | Attending: Cardiology | Admitting: Cardiology

## 2023-08-04 ENCOUNTER — Encounter: Payer: Self-pay | Admitting: Cardiology

## 2023-08-04 VITALS — BP 118/78 | HR 93 | Ht 66.0 in | Wt 174.2 lb

## 2023-08-04 DIAGNOSIS — G4733 Obstructive sleep apnea (adult) (pediatric): Secondary | ICD-10-CM | POA: Diagnosis not present

## 2023-08-04 DIAGNOSIS — I480 Paroxysmal atrial fibrillation: Secondary | ICD-10-CM

## 2023-08-04 DIAGNOSIS — I483 Typical atrial flutter: Secondary | ICD-10-CM

## 2023-08-04 DIAGNOSIS — D6869 Other thrombophilia: Secondary | ICD-10-CM | POA: Diagnosis not present

## 2023-08-04 DIAGNOSIS — I10 Essential (primary) hypertension: Secondary | ICD-10-CM | POA: Diagnosis not present

## 2023-08-04 MED ORDER — FLECAINIDE ACETATE 100 MG PO TABS: 100.0000 mg | ORAL_TABLET | Freq: Two times a day (BID) | ORAL | 3 refills | Status: DC

## 2023-08-04 NOTE — Patient Instructions (Addendum)
Medication Instructions:  Your physician has recommended you make the following change in your medication:  Aumentar Flecainide a 100 mg dos veces al dia  *If you need a refill on your cardiac medications before your next appointment, please call your pharmacy*   Lab Work: None ordered   Testing/Procedures: None ordered   Follow-Up: At [facility], you and your health needs are our priority.  As part of our continuing mission to provide you with exceptional heart care, we have created designated Provider Care Teams.  These Care Teams include your primary Cardiologist (physician) and Advanced Practice Providers (APPs -  Physician Assistants and Nurse Practitioners) who all work together to provide you with the care you need, when you need it.  Your physician recommends that you schedule a follow-up appointment in: 2 weeks for nurse visit EKG (after increasing Flecainide)   Your next appointment:   3 month(s)  The format for your next appointment:   In Person  Provider:   Loman Brooklyn, MD    Thank you for choosing [facility]!!   Dory Horn, RN [phone]  Other Instructions  Fibrilacin auricular Atrial Fibrillation La fibrilacin auricular (FA) es un tipo de latido cardaco irregular o rpido. Si tiene FA, el corazn late sin ningn orden. Esto dificulta el bombeo de la sangre por parte del corazn de [place] normal. La FA puede aparecer y Geneticist, molecular, o puede convertirse en un problema prolongado. Si la FA no se trata, puede aumentar el riesgo de accidente cerebrovascular, insuficiencia cardaca y otros problemas cardacos. Cules son las causas? La FA puede ser causada por enfermedades que daan el sistema elctrico del corazn. Entre ellas, se incluyen las siguientes: Presin arterial alta. Insuficiencia cardaca. Enfermedades de las vlvulas cardacas. Ciruga cardaca. Diabetes. Enfermedad tiroidea. Enfermedad renal. Enfermedades pulmonares, como  neumona o EPOC. Apnea del sueo. Algunas veces, la causa no se conoce. Qu incrementa el riesgo? Es ms probable que desarrolle una FA en los siguientes casos: Es una persona de [place]. Hace ejercicio muy extenuante con frecuencia. Tiene antecedentes familiares de FA. Es hombre. Es caucsico. Tiene sobrepeso. Fuma. Bebe mucho alcohol. Cules son los signos o sntomas? Los sntomas frecuentes de esta afeccin [employer] siguientes: Sensacin de que el corazn late [place] rpido. Dolor o International aid/development worker. Falta de aire. Sensacin repentina de debilidad o de desvanecimiento. Cansarse con facilidad al hacer actividad fsica. Desmayos. Sudoracin. En algunos casos, no hay sntomas. Cmo se trata? Medicamentos para: Chief of Staff de [place]. Tratar los problemas de frecuencia cardaca o de ritmo cardaco. Usar dispositivos, por ejemplo, un marcapasos, para corregir problemas del ritmo cardaco. Garnetta Buddy ciruga para extirpar la parte del corazn que enva seales incorrectas. Cerrar una zona donde se pueden formar cogulos en el corazn (orejuela auricular izquierda). En algunos casos, el mdico tratar otras afecciones subyacentes. Siga estas indicaciones en su casa: Medicamentos Use los medicamentos de venta libre y los recetados solamente como se lo haya indicado el mdico. No tome ningn medicamento nuevo sin hablar primero con su mdico. Si est tomando anticoagulantes, tenga en cuenta lo siguiente: Hable con el mdico antes de tomar aspirina o antiinflamatorios no esteroideos (AINE), como el ibuprofeno. Tome los medicamentos segn las indicaciones. Tmelos a la [employer]. No haga cosas que puedan lastimarlo o causarle moretones. Sea cuidadoso para evitar las cadas. Use una pulsera de alerta o lleve una tarjeta que diga que toma anticoagulantes. Estilo de vida No fume ni consuma ningn producto que  contenga nicotina o tabaco. Si  necesita ayuda para dejar de consumir estos productos, consulte al mdico. Consuma alimentos cardiosaludables. Hable con su mdico sobre el plan de alimentacin adecuado para usted. Haga ejercicios con regularidad tal como se lo indic el mdico. No beba alcohol. Baje de peso si es necesario. Indicaciones generales Si tiene un apnea del sueo, trtela segn las indicaciones del mdico. No use pldoras para bajar de peso salvo que su mdico le diga que son seguras. Estas pastillas pueden agravar los problemas cardacos. Concurra a todas las visitas de seguimiento. El mdico verificar la frecuencia cardaca y el ritmo cardaco con regularidad. Comunquese con un mdico si: Nota un cambio en la velocidad, el ritmo o la fuerza de los latidos cardacos. Toma medicamentos anticoagulantes y tiene ms moretones. Se cansa con ms facilidad cuando se mueve o hace ejercicio. Tiene un cambio repentino [employer]. Solicite ayuda de inmediato si:  Midwife. Tiene dificultad para respirar. Tiene efectos secundarios de los anticoagulantes, como sangre en el vmito, la materia fecal (heces) o el pis (orina), o sangrado que no puede detenerse. Tiene algn signo de accidente cerebrovascular. "BE FAST" es una manera fcil de recordar las principales seales de advertencia: B: Balance (equilibrio). Mareo, dificultad repentina para caminar o prdida del equilibrio. E: Eyes (ojos). Dificultad para ver o un cambio en cmo ve. F: Face (rostro). Debilidad repentina o prdida de la sensibilidad en la cara. Su rostro o prpado pueden caerse hacia un lado. A: Arms (brazos). Debilidad o prdida de la sensibilidad en un brazo. Esto sucede de repente y generalmente en un lado del cuerpo. S: Speech (habla). Dificultad repentina para hablar, hablar arrastrando las palabras o dificultad para comprender lo que las [employer]. T: Time (tiempo).Es tiempo de llamar al servicio de Sports administrator. Anote la hora a  la que [employer] sntomas. Presenta otros signos de un accidente cerebrovascular, como los siguientes: Dolor de [place] repentino y muy intenso sin causa aparente. Ganas de vomitar (nuseas). Vmitos. Una convulsin. Estos sntomas pueden Customer service manager. Solicite ayuda de inmediato. Llame al 911. No espere a ver si los sntomas desaparecen. No conduzca por sus propios medios [employer]. Esta informacin no tiene Theme park manager el consejo del mdico. Asegrese de hacerle al mdico cualquier pregunta que tenga. Document Revised: 10/16/2022 Document Reviewed: 10/16/2022 Elsevier Patient Education  2024 [employer].

## 2023-08-04 NOTE — Progress Notes (Signed)
  Electrophysiology Office Note:   Date:  08/04/2023  ID:  Maxwell Hopkins 10-Dec-1946, MRN [id number]  Primary Cardiologist: [name], MD Electrophysiologist: None      History of Present Illness:   Maxwell Hopkins is a 77 y.o. male with h/o hypertension, hyperlipidemia, borderline diabetes, atrial fibrillation/flutter seen today for  for Electrophysiology evaluation of atrial fibrillation at the request of [name].   He feels well currently.  He does note palpitations, fatigue, shortness of breath when he is in atrial fibrillation.  He has no exacerbating or alleviating factors.  He feels that he is in atrial fibrillation a few times a day.  At this point, he would like an alternative rhythm control strategy, though he is not sure about procedures.     He has a history of atrial fibrillation.  He is on rhythm control with flecainide.  Despite this, he continues to have an elevated burden at 35%.  He has obstructive struct of sleep apnea and is treated with CPAP.      Review of systems complete and found to be negative unless listed in HPI.   EP Information / Studies Reviewed:    EKG is not ordered today. EKG from 07/15/23 reviewed which showed sinus rhythm      Risk Assessment/Calculations:    CHA2DS2-VASc Score = 3   This indicates a 3.2% annual risk of stroke. The patient's score is based upon: CHF History: 0 HTN History: 1 Diabetes History: 0 Stroke History: 0 Vascular Disease History: 0 Age Score: 2 Gender Score: 0             Physical Exam:   VS:  BP 118/78   Pulse 93   Ht 5\' 6"  (1.676 m)   Wt 174 lb 3.2 oz (79 kg)   SpO2 94%   BMI 28.12 kg/m    Wt Readings from Last 3 Encounters:  08/04/23 174 lb 3.2 oz (79 kg)  07/15/23 175 lb 9.6 oz (79.7 kg)  04/10/23 173 lb (78.5 kg)     GEN: Well nourished, well developed in no acute distress NECK: No JVD; No carotid bruits CARDIAC: Irregularly irregular rate and rhythm, no murmurs, rubs,  gallops RESPIRATORY:  Clear to auscultation without rales, wheezing or rhonchi  ABDOMEN: Soft, non-tender, non-distended EXTREMITIES:  No edema; No deformity   ASSESSMENT AND PLAN:    1.  Paroxysmal atrial fibrillation/flutter: Burden of 35% on cardiac monitor.  Currently on flecainide and Eliquis.  He continues to have episodes of atrial fibrillation and flutter.  I have discussed ablation versus medication management.  At this point, he would prefer to avoid ablation.   increase flecainide to 100 mg.  2.  Hypertension: Currently well-controlled  3.  Obstructive sleep apnea: CPAP compliance encouraged  4.  Secondary hypercoagulable state: Currently on Eliquis for atrial fibrillation  Follow up with Dr. Elberta Fortis in 3 months  Signed,  Jorja Loa, MD

## 2023-08-19 ENCOUNTER — Ambulatory Visit: Payer: Medicare HMO | Attending: Cardiology

## 2023-08-19 ENCOUNTER — Telehealth: Payer: Self-pay

## 2023-08-19 VITALS — BP 112/60 | HR 86 | Ht 66.0 in | Wt 175.6 lb

## 2023-08-19 DIAGNOSIS — I480 Paroxysmal atrial fibrillation: Secondary | ICD-10-CM

## 2023-08-19 NOTE — Telephone Encounter (Signed)
Follow up Nurse Visit per Dr. Kirtland Bouchard verbally after reviewing EKG: He's ok, continue with present management and follow up as schedule. Patient notified

## 2023-08-19 NOTE — Progress Notes (Signed)
   Nurse Visit   Date of Encounter: 08/19/2023 ID: Maxwell Hopkins, Maxwell Hopkins [birth year], MRN [id number]  PCP:  Maxwell Rakes, MD   [facility] Providers Cardiologist:  [name], MD      Visit Details   VS:  There were no vitals taken for this visit. , BMI There is no height or weight on file to calculate BMI.  Wt Readings from Last 3 Encounters:  08/04/23 174 lb 3.2 oz (79 kg)  07/15/23 175 lb 9.6 oz (79.7 kg)  04/10/23 173 lb (78.5 kg)     Reason for visit: Follow up EKG per Dr. Elberta Fortis- after increaqsing Flecainide to 100mg  1 twice daily Performed today: Vitals, EKG Changes (medications, testing, etc.) : Continue same dose of Flecainide and follow up as directed. Per Dr. Bing Matter. Will route to Dr. Elberta Fortis.  Length of Visit: 15 minutes    Medications Adjustments/Labs and Tests Ordered: Orders Placed This Encounter  Procedures   EKG 12-Lead   No orders of the defined types were placed in this encounter.    Signed, [name], RN  08/19/2023 9:03 AM

## 2023-08-21 DIAGNOSIS — G4733 Obstructive sleep apnea (adult) (pediatric): Secondary | ICD-10-CM | POA: Diagnosis not present

## 2023-09-02 DIAGNOSIS — Z01818 Encounter for other preprocedural examination: Secondary | ICD-10-CM | POA: Diagnosis not present

## 2023-09-02 DIAGNOSIS — H25812 Combined forms of age-related cataract, left eye: Secondary | ICD-10-CM | POA: Diagnosis not present

## 2023-09-02 DIAGNOSIS — H25813 Combined forms of age-related cataract, bilateral: Secondary | ICD-10-CM | POA: Diagnosis not present

## 2023-09-03 DIAGNOSIS — Z23 Encounter for immunization: Secondary | ICD-10-CM | POA: Diagnosis not present

## 2023-09-08 DIAGNOSIS — Z01818 Encounter for other preprocedural examination: Secondary | ICD-10-CM | POA: Diagnosis not present

## 2023-09-09 ENCOUNTER — Telehealth: Payer: Self-pay

## 2023-09-09 DIAGNOSIS — H259 Unspecified age-related cataract: Secondary | ICD-10-CM | POA: Diagnosis not present

## 2023-09-09 DIAGNOSIS — H25812 Combined forms of age-related cataract, left eye: Secondary | ICD-10-CM | POA: Diagnosis not present

## 2023-09-09 DIAGNOSIS — Z79899 Other long term (current) drug therapy: Secondary | ICD-10-CM | POA: Diagnosis not present

## 2023-09-09 DIAGNOSIS — H538 Other visual disturbances: Secondary | ICD-10-CM | POA: Diagnosis not present

## 2023-09-09 DIAGNOSIS — I10 Essential (primary) hypertension: Secondary | ICD-10-CM | POA: Diagnosis not present

## 2023-09-09 DIAGNOSIS — Z7901 Long term (current) use of anticoagulants: Secondary | ICD-10-CM | POA: Diagnosis not present

## 2023-09-09 NOTE — Telephone Encounter (Signed)
Pt is scheduled for tele on 10/14 at 10am. Med rec and consent done

## 2023-09-09 NOTE — Telephone Encounter (Signed)
   Name: DATAVIOUS KYGER  DOB: 01-22-1946  MRN: [id number]  Primary Cardiologist: [name], MD   Preoperative team, please contact this patient and set up a phone call appointment for further preoperative risk assessment. Please obtain consent and complete medication review. Thank you for your help.Saw Camnitz for Afib management only. Saw Krasowski in July, 2024.   I confirm that guidance regarding antiplatelet and oral anticoagulation therapy has been completed and, if necessary, noted below.  Per office protocol, patient can hold Eliquis for 1-2 days prior to procedure.    Per office protocol, if patient is without any new symptoms or concerns at the time of their virtual visit, he/she may hold Eliquis for 1-2  days prior to procedure. Please resume Eliquis  as soon as possible postprocedure, at the discretion of the surgeon.     Joni Reining, NP 09/09/2023, 11:27 AM [facility]

## 2023-09-09 NOTE — Telephone Encounter (Signed)
Pt is scheduled for tele on 10/14 at 10am. Med rec and consent done    Patient Consent for Virtual Visit        Maxwell Hopkins has provided verbal consent on 09/09/2023 for a virtual visit (video or telephone).   CONSENT FOR VIRTUAL VISIT FOR:  Maxwell Hopkins  By participating in this virtual visit I agree to the following:  I hereby voluntarily request, consent and authorize [facility] and its employed or contracted physicians, physician assistants, nurse practitioners or other licensed health care professionals (the Practitioner), to provide me with telemedicine health care services (the "Services") as deemed necessary by the treating Practitioner. I acknowledge and consent to receive the Services by the Practitioner via telemedicine. I understand that the telemedicine visit will involve communicating with the Practitioner through live audiovisual communication technology and the disclosure of certain medical information by electronic transmission. I acknowledge that I have been given the opportunity to request an in-person assessment or other available alternative prior to the telemedicine visit and am voluntarily participating in the telemedicine visit.  I understand that I have the right to withhold or withdraw my consent to the use of telemedicine in the course of my care at any time, without affecting my right to future care or treatment, and that the Practitioner or I may terminate the telemedicine visit at any time. I understand that I have the right to inspect all information obtained and/or recorded in the course of the telemedicine visit and may receive copies of available information for a reasonable fee.  I understand that some of the potential risks of receiving the Services via telemedicine include:  Delay or interruption in medical evaluation due to technological equipment failure or disruption; Information transmitted may not be sufficient (e.g. poor resolution of  images) to allow for appropriate medical decision making by the Practitioner; and/or  In rare instances, security protocols could fail, causing a breach of personal health information.  Furthermore, I acknowledge that it is my responsibility to provide information about my medical history, conditions and care that is complete and accurate to the best of my ability. I acknowledge that Practitioner's advice, recommendations, and/or decision may be based on factors not within their control, such as incomplete or inaccurate data provided by me or distortions of diagnostic images or specimens that may result from electronic transmissions. I understand that the practice of medicine is not an exact science and that Practitioner makes no warranties or guarantees regarding treatment outcomes. I acknowledge that a copy of this consent can be made available to me via my patient portal [facility] MyChart), or I can request a printed copy by calling the office of [facility].    I understand that my insurance will be billed for this visit.   I have read or had this consent read to me. I understand the contents of this consent, which adequately explains the benefits and risks of the Services being provided via telemedicine.  I have been provided ample opportunity to ask questions regarding this consent and the Services and have had my questions answered to my satisfaction. I give my informed consent for the services to be provided through the use of telemedicine in my medical care

## 2023-09-09 NOTE — Telephone Encounter (Signed)
   Pre-operative Risk Assessment    Patient Name: Maxwell Hopkins  DOB: Sep 05, 1946 MRN: [id number]      Request for Surgical Clearance    Procedure:   colonoscopy  Date of Surgery:  Clearance 10/24/23                                 Surgeon:  Dr. Marcial Pacas [place] Surgeon's Group or Practice Name:  [facility]  Phone number:  [phone] Fax number:  [phone]   Type of Clearance Requested:   - Pharmacy:  Hold Apixaban (Eliquis) on October 23   Type of Anesthesia:  Not Indicated   Additional requests/questions:    Merlene Laughter   09/09/2023, 7:45 AM

## 2023-09-09 NOTE — Telephone Encounter (Signed)
Patient with diagnosis of afib/flutter on Eliquis for anticoagulation.    Procedure: colonoscopy Date of procedure: 10/24/23  CHA2DS2-VASc Score = 3  This indicates a 3.2% annual risk of stroke. The patient's score is based upon: CHF History: 0 HTN History: 1 Diabetes History: 0 Stroke History: 0 Vascular Disease History: 0 Age Score: 2 Gender Score: 0   CrCl 28mL/min using adjusted body weight Platelet count 211K  Per office protocol, patient can hold Eliquis for 1-2 days prior to procedure.    **This guidance is not considered finalized until pre-operative APP has relayed final recommendations.**

## 2023-09-09 NOTE — Telephone Encounter (Signed)
Pharmacy please advise on holding Eliquis prior to colonscopy scheduled for 10/24/2023. Thank you.

## 2023-09-21 DIAGNOSIS — G4733 Obstructive sleep apnea (adult) (pediatric): Secondary | ICD-10-CM | POA: Diagnosis not present

## 2023-10-07 DIAGNOSIS — H259 Unspecified age-related cataract: Secondary | ICD-10-CM | POA: Diagnosis not present

## 2023-10-07 DIAGNOSIS — H25811 Combined forms of age-related cataract, right eye: Secondary | ICD-10-CM | POA: Diagnosis not present

## 2023-10-07 DIAGNOSIS — Z79899 Other long term (current) drug therapy: Secondary | ICD-10-CM | POA: Diagnosis not present

## 2023-10-07 DIAGNOSIS — I4891 Unspecified atrial fibrillation: Secondary | ICD-10-CM | POA: Diagnosis not present

## 2023-10-07 DIAGNOSIS — G4733 Obstructive sleep apnea (adult) (pediatric): Secondary | ICD-10-CM | POA: Diagnosis not present

## 2023-10-07 DIAGNOSIS — Z7901 Long term (current) use of anticoagulants: Secondary | ICD-10-CM | POA: Diagnosis not present

## 2023-10-07 DIAGNOSIS — Z6828 Body mass index (BMI) 28.0-28.9, adult: Secondary | ICD-10-CM | POA: Diagnosis not present

## 2023-10-07 DIAGNOSIS — I10 Essential (primary) hypertension: Secondary | ICD-10-CM | POA: Diagnosis not present

## 2023-10-07 DIAGNOSIS — E669 Obesity, unspecified: Secondary | ICD-10-CM | POA: Diagnosis not present

## 2023-10-09 ENCOUNTER — Telehealth: Payer: Self-pay

## 2023-10-09 NOTE — Telephone Encounter (Signed)
Dr. Jennye Boroughs notified of clearance status per fax.

## 2023-10-13 ENCOUNTER — Encounter: Payer: Self-pay | Admitting: Nurse Practitioner

## 2023-10-13 ENCOUNTER — Ambulatory Visit: Payer: Medicare HMO | Attending: Nurse Practitioner | Admitting: Nurse Practitioner

## 2023-10-13 DIAGNOSIS — Z01810 Encounter for preprocedural cardiovascular examination: Secondary | ICD-10-CM | POA: Diagnosis not present

## 2023-10-13 NOTE — Progress Notes (Signed)
Virtual Visit via Telephone Note   Because of Maxwell Hopkins's co-morbid illnesses, he is at least at moderate risk for complications without adequate follow up.  This format is felt to be most appropriate for this patient at this time.  The patient did not have access to video technology/had technical difficulties with video requiring transitioning to audio format only (telephone).  All issues noted in this document were discussed and addressed.  No physical exam could be performed with this format.  Please refer to the patient's chart for his consent to telehealth for [facility].  Evaluation Performed:  Preoperative cardiovascular risk assessment _____________   Date:  10/13/2023   Patient ID:  Maxwell, Hopkins Feb 01, 1946, MRN [id number] Patient Location:  Home Provider location:   Office  Primary Care Provider:  [name], MD Primary Cardiologist:  [name], MD  Chief Complaint / Patient Profile   77 y.o. y/o male with a h/o hypertension, hyperlipidemia, borderline diabetes, atrial fibrillation/flutter on chronic anticoagulation, OSA on CPAP who is pending colonoscopy and presents today for telephonic preoperative cardiovascular risk assessment. This call was completed with the assistance of Spanish Interpretor # [phone] with [employer] interpret[employer].   History of Present Illness    Maxwell Hopkins is a 77 y.o. male who presents via audio/video conferencing for a telehealth visit today.  Pt was last seen in cardiology clinic on 08/04/2023 by Dr. Elberta Fortis.  At that time Maxwell Hopkins was having A-fib about 35% of the time based on cardiac monitor and flecainide was increased to 100 mg twice daily.  The patient is now pending procedure as outlined above. Since his last visit, he denies chest pain, shortness of breath, lower extremity edema, fatigue, palpitations, melena, hematuria, hemoptysis, diaphoresis, weakness, presyncope, syncope, orthopnea, and PND. He  remains active with no concerning symptoms although he has been somewhat limited recently by cataract surgery.    Past Medical History    Past Medical History:  Diagnosis Date   Atrial flutter (HCC) 10/05/2021   Atrial tachycardia    Borderline diabetes 10/05/2021   Dyslipidemia 10/05/2021   ED (erectile dysfunction)    Essential hypertension 10/05/2021   Hyperlipidemia    Hypertension    Palpitations    Prediabetes    Past Surgical History:  Procedure Laterality Date   NO PAST SURGERIES      Allergies  Allergies  Allergen Reactions   Penicillin G     Home Medications    Prior to Admission medications   Medication Sig Start Date End Date Taking? Authorizing Provider  apixaban (ELIQUIS) 5 MG TABS tablet TAKE 1 TABLET BY MOUTH TWICE A DAY 07/14/23   Georgeanna Lea, MD  atorvastatin (LIPITOR) 40 MG tablet Take 40 mg by mouth daily. 02/03/21   [provider]  flecainide (TAMBOCOR) 100 MG tablet Take 1 tablet (100 mg total) by mouth 2 (two) times daily. 08/04/23   Camnitz, Will Daphine Deutscher, MD  fluticasone (FLONASE) 50 MCG/ACT nasal spray Place 1 spray into both nostrils daily.    [provider]  metoprolol succinate (TOPROL-XL) 50 MG 24 hr tablet TAKE 1 TABLET (50 MG TOTAL) BY MOUTH SEE ADMIN INSTRUCTIONS. 1 TAB IN THE MORNING AND 1/2 TAB IN THE EVENINGS Patient taking differently: Take 50 mg by mouth daily. 07/14/23   Georgeanna Lea, MD  tadalafil (CIALIS) 5 MG tablet Take 5 mg by mouth daily.    [provider]    Physical Exam  Vital Signs:  ZIHAO MCCOLE does not have vital signs available for review today.  Given telephonic nature of communication, physical exam is limited. AAOx3. NAD. Normal affect.  Speech and respirations are unlabored.  Accessory Clinical Findings    None  Assessment & Plan    1.  Preoperative Cardiovascular Risk Assessment: .According to the Revised Cardiac Risk Index (RCRI), his Perioperative Risk of Major  Cardiac Event is (%): 0.4. His Functional Capacity in METs is: 7.04 according to the Duke Activity Status Index (DASI). The patient is doing well from a cardiac perspective. Therefore, based on ACC/AHA guidelines, the patient would be at acceptable risk for the planned procedure without further cardiovascular testing. I will send clearance to requesting provider.   The patient was advised that if he develops new symptoms prior to surgery to contact our office to arrange for a follow-up visit, and he verbalized understanding.   Per office protocol, patient can hold Eliquis for 1-2 days prior to procedure.   A copy of this note will be routed to requesting surgeon.  Time:   Today, I have spent 10 minutes with the patient with telehealth technology discussing medical history, symptoms, and management plan.    Levi Aland, NP-C 10/13/2023, 10:04 AM [address] Office [phone] Fax [phone]

## 2023-10-21 ENCOUNTER — Ambulatory Visit: Payer: Medicare HMO | Attending: Cardiology | Admitting: Cardiology

## 2023-10-21 ENCOUNTER — Encounter: Payer: Self-pay | Admitting: Cardiology

## 2023-10-21 VITALS — BP 110/68 | HR 72 | Ht 66.0 in | Wt 178.0 lb

## 2023-10-21 DIAGNOSIS — G4733 Obstructive sleep apnea (adult) (pediatric): Secondary | ICD-10-CM | POA: Diagnosis not present

## 2023-10-21 DIAGNOSIS — E782 Mixed hyperlipidemia: Secondary | ICD-10-CM | POA: Diagnosis not present

## 2023-10-21 DIAGNOSIS — I480 Paroxysmal atrial fibrillation: Secondary | ICD-10-CM

## 2023-10-21 DIAGNOSIS — R7303 Prediabetes: Secondary | ICD-10-CM | POA: Diagnosis not present

## 2023-10-21 NOTE — Patient Instructions (Signed)

## 2023-10-21 NOTE — Progress Notes (Signed)
Cardiology Office Note:    Date:  10/21/2023   ID:  Maxwell, Hopkins 09/30/1946, MRN [id number]  PCP:  [name], MD  Cardiologist:  [name], MD    Referring MD: [name], MD   Chief Complaint  Patient presents with   Follow-up  Doing fine  History of Present Illness:    Maxwell Hopkins is a 77 y.o. male past medical history significant proximal to fibrillation, he is anticoag with Eliquis, he is not a rhythm control strategy initially accomplished with flecainide 50 twice daily but does still having more problems did see our EP team and discussion of potential ablation versus medication he elected to proceed with medications, flecainide has been increased from 50 to 100 mg twice daily since that time he is doing much better.  He does have recognize moderate obstructive sleep apnea, essential hypertension, dyslipidemia. Comes today to months for follow-up.  Overall doing well.  Denies of any chest pain tightness squeezing pressure mid chest no palpitation dizziness swelling of lower extremities overall doing well with higher dose of flecainide  Past Medical History:  Diagnosis Date   Atrial flutter (HCC) 10/05/2021   Atrial tachycardia (HCC)    Borderline diabetes 10/05/2021   Dyslipidemia 10/05/2021   ED (erectile dysfunction)    Essential hypertension 10/05/2021   Hyperlipidemia    Hypertension    Palpitations    Prediabetes     Past Surgical History:  Procedure Laterality Date   NO PAST SURGERIES      Current Medications: Current Meds  Medication Sig   apixaban (ELIQUIS) 5 MG TABS tablet TAKE 1 TABLET BY MOUTH TWICE A DAY   atorvastatin (LIPITOR) 40 MG tablet Take 40 mg by mouth daily.   flecainide (TAMBOCOR) 100 MG tablet Take 1 tablet (100 mg total) by mouth 2 (two) times daily.   fluticasone (FLONASE) 50 MCG/ACT nasal spray Place 1 spray into both nostrils daily.   metoprolol succinate (TOPROL-XL) 50 MG 24 hr tablet TAKE 1 TABLET (50 MG  TOTAL) BY MOUTH SEE ADMIN INSTRUCTIONS. 1 TAB IN THE MORNING AND 1/2 TAB IN THE EVENINGS (Patient taking differently: Take 50 mg by mouth daily.)   tadalafil (CIALIS) 5 MG tablet Take 5 mg by mouth daily.   tamsulosin (FLOMAX) 0.4 MG CAPS capsule Take 0.4 mg by mouth 2 (two) times daily.     Allergies:   Penicillin g   Social History   Socioeconomic History   Marital status: Married    Spouse name: Not on file   Number of children: Not on file   Years of education: Not on file   Highest education level: Not on file  Occupational History   Not on file  Tobacco Use   Smoking status: Never   Smokeless tobacco: Never  Substance and Sexual Activity   Alcohol use: Not Currently   Drug use: Never   Sexual activity: Yes  Other Topics Concern   Not on file  Social History Narrative   Not on file   Social Determinants of Health   Financial Resource Strain: Not on file  Food Insecurity: Not on file  Transportation Needs: Not on file  Physical Activity: Not on file  Stress: Not on file  Social Connections: Not on file     Family History: The patient's Family history is unknown by patient. ROS:   Please see the history of present illness.    All 14 point review of systems negative except as described per history  of present illness  EKGs/Labs/Other Studies Reviewed:         Recent Labs: 12/25/2022: BUN 22; Creatinine, Ser 0.67; Hemoglobin 13.4; Platelets 211; Potassium 4.4; Sodium 140  Recent Lipid Panel No results found for: "CHOL", "TRIG", "HDL", "CHOLHDL", "VLDL", "LDLCALC", "LDLDIRECT"  Physical Exam:    VS:  BP 110/68 (BP Location: Left Arm, Patient Position: Sitting)   Pulse 72   Ht 5\' 6"  (1.676 m)   Wt 178 lb (80.7 kg)   SpO2 95%   BMI 28.73 kg/m     Wt Readings from Last 3 Encounters:  10/21/23 178 lb (80.7 kg)  08/19/23 175 lb 9.6 oz (79.7 kg)  08/04/23 174 lb 3.2 oz (79 kg)     GEN:  Well nourished, well developed in no acute distress HEENT:  Normal NECK: No JVD; No carotid bruits LYMPHATICS: No lymphadenopathy CARDIAC: RRR, no murmurs, no rubs, no gallops RESPIRATORY:  Clear to auscultation without rales, wheezing or rhonchi  ABDOMEN: Soft, non-tender, non-distended MUSCULOSKELETAL:  No edema; No deformity  SKIN: Warm and dry LOWER EXTREMITIES: no swelling NEUROLOGIC:  Alert and oriented x 3 PSYCHIATRIC:  Normal affect   ASSESSMENT:    1. Paroxysmal atrial fibrillation (HCC)   2. Obstructive sleep apnea   3. Mixed hyperlipidemia   4. Borderline diabetes    PLAN:    In order of problems listed above:  Paroxysmal atrial fibrillation seems to doing quite well.  Continue present management including anticoagulation and flecainide. Obstructive sleep apnea: Being followed by antimedicine team. Mixed dyslipidemia did review K PN which show me his LDL 98 HDL 46 continue present management   Medication Adjustments/Labs and Tests Ordered: Current medicines are reviewed at length with the patient today.  Concerns regarding medicines are outlined above.  Orders Placed This Encounter  Procedures   EKG 12-Lead   Medication changes: No orders of the defined types were placed in this encounter.   Signed, Georgeanna Lea, MD, [facility] 10/21/2023 9:22 AM    [facility]

## 2023-10-24 DIAGNOSIS — I10 Essential (primary) hypertension: Secondary | ICD-10-CM | POA: Diagnosis not present

## 2023-10-24 DIAGNOSIS — Z860100 Personal history of colon polyps, unspecified: Secondary | ICD-10-CM | POA: Diagnosis not present

## 2023-10-24 DIAGNOSIS — D126 Benign neoplasm of colon, unspecified: Secondary | ICD-10-CM | POA: Diagnosis not present

## 2023-10-24 DIAGNOSIS — I4891 Unspecified atrial fibrillation: Secondary | ICD-10-CM | POA: Diagnosis not present

## 2023-10-24 DIAGNOSIS — K635 Polyp of colon: Secondary | ICD-10-CM | POA: Diagnosis not present

## 2023-10-24 DIAGNOSIS — K5730 Diverticulosis of large intestine without perforation or abscess without bleeding: Secondary | ICD-10-CM | POA: Diagnosis not present

## 2023-10-24 DIAGNOSIS — Z1211 Encounter for screening for malignant neoplasm of colon: Secondary | ICD-10-CM | POA: Diagnosis not present

## 2023-10-24 DIAGNOSIS — Z860109 Personal history of other colon polyps: Secondary | ICD-10-CM | POA: Diagnosis not present

## 2023-10-24 DIAGNOSIS — D124 Benign neoplasm of descending colon: Secondary | ICD-10-CM | POA: Diagnosis not present

## 2023-10-30 ENCOUNTER — Other Ambulatory Visit: Payer: Self-pay | Admitting: Cardiology

## 2023-10-31 DIAGNOSIS — E785 Hyperlipidemia, unspecified: Secondary | ICD-10-CM | POA: Diagnosis not present

## 2023-10-31 DIAGNOSIS — I10 Essential (primary) hypertension: Secondary | ICD-10-CM | POA: Diagnosis not present

## 2023-11-10 ENCOUNTER — Ambulatory Visit: Payer: Medicare HMO | Attending: Cardiology | Admitting: Cardiology

## 2023-11-10 ENCOUNTER — Encounter: Payer: Self-pay | Admitting: Cardiology

## 2023-11-10 VITALS — BP 120/74 | HR 59 | Ht 66.0 in | Wt 176.0 lb

## 2023-11-10 DIAGNOSIS — D6869 Other thrombophilia: Secondary | ICD-10-CM | POA: Diagnosis not present

## 2023-11-10 DIAGNOSIS — I10 Essential (primary) hypertension: Secondary | ICD-10-CM | POA: Diagnosis not present

## 2023-11-10 DIAGNOSIS — I480 Paroxysmal atrial fibrillation: Secondary | ICD-10-CM

## 2023-11-10 DIAGNOSIS — I483 Typical atrial flutter: Secondary | ICD-10-CM

## 2023-11-10 NOTE — Patient Instructions (Addendum)
Medication Instructions:  Your physician recommends that you continue on your current medications as directed. Please refer to the Current Medication list given to you today.  *If you need a refill on your cardiac medications before your next appointment, please call your pharmacy*   Lab Work: Pre procedure labs -- we will call you to schedule these in [place]:  BMP & CBC  If you have a lab test that is abnormal and we need to change your treatment, we will call you to review the results -- otherwise no news is good news.    Testing/Procedures: Your physician has requested that you have cardiac CT 1 month PRIOR to your ablation. Cardiac computed tomography (CT) is a painless test that uses an x-ray machine to take clear, detailed pictures of your heart.  We will call you to schedule.  Your physician has recommended that you have an ablation. Catheter ablation is a medical procedure used to treat some cardiac arrhythmias (irregular heartbeats). During catheter ablation, a long, thin, flexible tube is put into a blood vessel in your groin (upper thigh), or neck. This tube is called an ablation catheter. It is then guided to your heart through the blood vessel. Radio frequency waves destroy small areas of heart tissue where abnormal heartbeats may cause an arrhythmia to start.   Your ablation is scheduled for 01/23/2024. Please arrive at [facility] at 1:30 pm.  We will call you for further instructions.   Follow-Up: At [facility], you and your health needs are our priority.  As part of our continuing mission to provide you with exceptional heart care, we have created designated Provider Care Teams.  These Care Teams include your primary Cardiologist (physician) and Advanced Practice Providers (APPs -  Physician Assistants and Nurse Practitioners) who all work together to provide you with the care you need, when you need it.  Your next appointment:   1 month(s) after your  ablation  The format for your next appointment:   In Person  Provider:   AFib clinic   Thank you for choosing [facility]!!   Dory Horn, RN [phone]    Other Instructions   Ablacin cardaca Cardiac Ablation La ablacin cardaca es un procedimiento que se realiza para destruir, o extirpar, una pequea cantidad de tejido cardaco que causa problemas. El corazn tiene diferentes conexiones elctricas. En ocasiones, estas conexiones son anormales y Engineer, water que el corazn lata muy rpido o de manera irregular. Al Printmaker ablacin de las zonas anormales, el ritmo cardaco puede mejorarse o normalizarse. La ablacin se indica en aquellas personas que: Tienen latidos cardacos irregulares o rpidos (arritmia). Sufren el sndrome de Wolff-Parkinson-White. Han tomado medicamentos para la arritmia que no funcion o que les caus efectos secundarios. Tienen latidos cardacos de alto riesgo que pueden ser potencialmente mortales. Informe al mdico acerca de lo siguiente: Cualquier alergia que tenga. Todos los [employer] [place], incluidos vitaminas, hierbas, gotas oftlmicas, cremas y [address] de [address]. Cualquier problema que usted o los [employer] de su familia hayan tenido con el uso de anestesia. Cualquier problema de la sangre que tenga. Cirugas a las que se haya sometido. Cualquier afeccin mdica que tenga. Si est embarazada o podra estarlo. Cules son los riesgos? El mdico hablar con usted [employer]. Pueden incluir: Infeccin. Moretones y Landscape architect. Accidente cerebrovascular o cogulos de sangre. Daos a las estructuras o los rganos cercanos. Reaccin alrgica a los medicamentos o a los tintes. Necesidad de Art gallery manager si  el corazn se daa. El marcapasos es un dispositivo que ayuda a que el corazn lata con normalidad. Rayna Sexton del procedimiento. Es posible que haya que repetir el procedimiento. Qu ocurre antes del  procedimiento? Medicamentos Consulte al mdico si debe hacer o no lo siguiente: Multimedia programmer o suspender los medicamentos que [place] habitualmente. Estos incluyen cualquier medicamento para el ritmo cardaco, para la diabetes o anticoagulantes que use. Tomar medicamentos como aspirina e ibuprofeno. Estos medicamentos pueden tener un efecto anticoagulante en la [place]. No los tome, a menos que se lo indique el mdico. Usar medicamentos de venta libre, vitaminas, hierbas y suplementos. Instrucciones generales Siga las instrucciones del mdico respecto de lo que puede comer y beber. Si va a marcharse a su casa inmediatamente despus del procedimiento, pdale a un adulto responsable que: Lo lleve a su casa desde el hospital o la clnica. No se le permitir conducir. Lo cuide durante el [employer] indiquen. Pregntele al mdico qu medidas se tomarn para prevenir una infeccin. Qu ocurre durante el procedimiento?  Le colocarn una va intravenosa (IV) en una vena. Es posible que le administren lo siguiente: Un sedante. Esto lo ayuda a relajarse. Anestesia. Esto ayudar a: Adormecer ciertas zonas del cuerpo. Le harn una incisin en el cuello o en la ingle. Se insertar una aguja por la incisin y en una vena grande del cuello o de la ingle. El tubo pequeo y delgado (catter) se introducir con la aguja y se Water quality scientist. Se utilizar un tipo de radiografa (fluoroscopia) para guiar el catter y producir imgenes del corazn que se vern en un monitor. Es posible que se inyecten tintes por el catter para que el cirujano pueda Administrator, Civil Service del corazn que necesita tratamiento. Se enviarn corrientes elctricas desde el catter para destruir el tejido cardaco en las zonas determinadas. Existen tres tipos de energa que pueden usarse para hacerlo: Calor (energa de radiofrecuencia). Energa lser. Fro extremo (crioablacin). Cuando se haya destruido el tejido, se Social worker. Se presionar el lugar de insercin para evitar una hemorragia. Se colocar una venda (vendaje) sobre Immunologist de insercin. El procedimiento puede variar segn el mdico y el hospital. Ladell Heads ocurre despus del procedimiento? Le controlarn la presin arterial, el ritmo y la frecuencia cardaca, la frecuencia respiratoria y Air cabin crew de oxgeno en la sangre hasta que le den el alta del hospital o la clnica. Se controlar el lugar de insercin por si llegara a producirse una hemorragia. Deber permanecer acostado y quieto durante algunas horas. Si usaron la ingle para el procedimiento, deber Pharmacologist la pierna recta durante algunas horas despus de que le retiren Associate Professor. Esta informacin no tiene Theme park manager el consejo del mdico. Asegrese de hacerle al mdico cualquier pregunta que tenga. Document Revised: 07/01/2022 Document Reviewed: 07/01/2022 Elsevier Patient Education  2024 Elsevier Inc.    Ablacin cardaca, cuidados posteriores Cardiac Ablation, Care After La siguiente informacin ofrece orientacin sobre cmo cuidarse despus del procedimiento. El mdico tambin podr darle instrucciones ms especficas. Comunquese con el mdico si tiene problemas o preguntas. Qu puedo esperar despus del procedimiento? Despus del procedimiento, es normal tener los siguientes sntomas: Formacin de moretones y Engineer, mining leve alrededor del Environmental consultant de insercin. Latidos cardacos irregulares. Cansancio (fatiga). Siga estas instrucciones en su casa: Cuidados del lugar de la insercin  Siga las instrucciones del mdico acerca de los cuidados del lugar de la insercin. Asegrese de hacer lo siguiente: Lvese las manos con agua y [place]  al menos 20 segundos antes y despus de cambiarse la venda (vendaje). Use desinfectante para manos si no dispone de [place] y [place]. Cambie el vendaje como se lo haya indicado el mdico. No retire los puntos (suturas), la goma para cerrar la piel  o las tiras [place]. Es posible que estos cierres cutneos deban quedar puestos en la piel durante 2 semanas o ms tiempo. Si los bordes de las tiras [address] empiezan a despegarse y Scientific laboratory technician, puede recortar los que estn sueltos. No retire las tiras [employer] por completo a menos que el mdico se lo indique. Haematologist de insercin todos los [address]  [address] para detectar signos de infeccin. Est atento a los siguientes signos: Aumento del enrojecimiento, la hinchazn o Chief Technology Officer. Lquido o sangre. Calor. Pus o mal olor. Si el lugar de insercin comienza a Geophysicist/field seismologist, recustese boca arriba, haga presin firmemente sobre la zona y llame al mdico. Actividad Evite realizar actividades que demanden mucho esfuerzo durante, al [place], 3 das despus del procedimiento. Es posible que Personnel officer objetos. Pregntele al mdico cunto peso puede levantar sin correr [employer]. Retome sus actividades normales como se lo haya indicado el mdico. Pregntele al mdico qu actividades son seguras para usted. Instrucciones generales Si le administraron un sedante durante el procedimiento, puede afectarlo por varias horas. No conduzca ni opere maquinaria hasta que el mdico le indique que es seguro [place]. Use los medicamentos de venta libre y los recetados solamente como se lo haya indicado el mdico. No consuma ningn producto que contenga nicotina o tabaco. Estos productos incluyen cigarrillos, tabaco para Theatre manager y aparatos de vapeo, como los Administrator, Civil Service. Si necesita ayuda para dejar de fumar, consulte al mdico. No tome baos de inmersin, no nade ni use el jacuzzi hasta que el mdico lo autorice. Pregntele al mdico si puede ducharse. Delle Reining solo le permitan darse baos de [place]. No beba alcohol durante las 24 horas posteriores al procedimiento. Concurra a todas las visitas de seguimiento. El mdico querr controlar su frecuencia y ritmo cardacos nuevamente despus del  procedimiento. Comunquese con un mdico si: Tiene fiebre. Nota signos de infeccin alrededor del lugar de insercin del catter. Tiene sudoracin abundante. Siente nuseas. Siente dolor o adormecimiento en el brazo o la pierna ms cercanos al lugar de insercin. Se siente mareado o aturdido. Tiene latidos cardacos irregulares o acelerados (palpitaciones). Solicite ayuda de inmediato si: El lugar de insercin se hincha repentinamente. Se le cae la cara, se le debilita el brazo o tiene una dificultad repentina para caminar o hablar. Siente molestias o dolor en el pecho que se extienden [employer] cuello, la mandbula o el brazo. Le falta el aire. Estos sntomas pueden Customer service manager. Solicite ayuda de inmediato. Llame al 911. No espere a ver si los sntomas desaparecen. No conduzca por sus propios medios [employer]. Esta informacin no tiene Theme park manager el consejo del mdico. Asegrese de hacerle al mdico cualquier pregunta que tenga. Document Revised: 07/01/2022 Document Reviewed: 07/01/2022 Elsevier Patient Education  2024 [employer].

## 2023-11-10 NOTE — Progress Notes (Signed)
  Electrophysiology Office Note:   Date:  11/10/2023  ID:  Maxwell Hopkins, Maxwell Hopkins May 22, 1946, MRN [id number]  Primary Cardiologist: [name], MD Electrophysiologist: Regan Lemming, MD      History of Present Illness:   Maxwell Hopkins is a 77 y.o. male with h/o hypertension, hyperlipidemia, diabetes, atrial fibrillation/flutter, sleep apnea seen today for routine electrophysiology followup.   Since last being seen in our clinic the patient reports doing overall well.  He has noted minimal episodes of atrial fibrillation since last being seen.  Despite this, he feels that the flecainide is causing some side effects.  He feels bloated and constipated since starting this medication.  He would like an alternative rhythm control strategy.  he denies chest pain, palpitations, dyspnea, PND, orthopnea, nausea, vomiting, dizziness, syncope, edema, weight gain, or early satiety.   Review of systems complete and found to be negative unless listed in HPI.   EP Information / Studies Reviewed:    EKG is not ordered today. EKG from 10/21/23 reviewed which showed sinus rhythm, 1dAVB        Risk Assessment/Calculations:    CHA2DS2-VASc Score = 3   This indicates a 3.2% annual risk of stroke. The patient's score is based upon: CHF History: 0 HTN History: 1 Diabetes History: 0 Stroke History: 0 Vascular Disease History: 0 Age Score: 2 Gender Score: 0             Physical Exam:   VS:  BP 120/74   Pulse (!) 59   Ht 5\' 6"  (1.676 m)   Wt 176 lb (79.8 kg)   SpO2 93%   BMI 28.41 kg/m    Wt Readings from Last 3 Encounters:  11/10/23 176 lb (79.8 kg)  10/21/23 178 lb (80.7 kg)  08/19/23 175 lb 9.6 oz (79.7 kg)     GEN: Well nourished, well developed in no acute distress NECK: No JVD; No carotid bruits CARDIAC: Regular rate and rhythm, no murmurs, rubs, gallops RESPIRATORY:  Clear to auscultation without rales, wheezing or rhonchi  ABDOMEN: Soft, non-tender,  non-distended EXTREMITIES:  No edema; No deformity   ASSESSMENT AND PLAN:    1.  Paroxysmal atrial fibrillation/flutter: Currently on flecainide.  He is having side effects from the flecainide.  He would prefer to avoid other antiarrhythmics.  Due to that, we Zebulun Deman plan for ablation.  Risks and benefits have been discussed.  He understands the risks and is agreed to the procedure.  Risk, benefits, and alternatives to EP study and radiofrequency/pulse field ablation for afib were also discussed in detail today. These risks include but are not limited to stroke, bleeding, vascular damage, tamponade, perforation, damage to the esophagus, lungs, and other structures, pulmonary vein stenosis, worsening renal function, and death. The patient understands these risk and wishes to proceed.  We Marcia Lepera therefore proceed with catheter ablation at the next available time.  Carto, ICE, anesthesia are requested for the procedure.  Elaura Calix also obtain CT PV protocol prior to the procedure to exclude LAA thrombus and further evaluate atrial anatomy.  2.  Hypertension: Currently well-controlled  3.  Obstructive sleep apnea: CPAP compliance encouraged  4.  Secondary hypercoagulable state: Currently on Eliquis for atrial fibrillation  Follow up with Dr. Elberta Fortis as usual post procedure  Signed, Cora Brierley Jorja Loa, MD

## 2023-11-21 ENCOUNTER — Telehealth: Payer: Self-pay | Admitting: *Deleted

## 2023-11-21 DIAGNOSIS — G4733 Obstructive sleep apnea (adult) (pediatric): Secondary | ICD-10-CM | POA: Diagnosis not present

## 2023-11-21 NOTE — Addendum Note (Signed)
Addended by: Baird Lyons on: 11/21/2023 11:05 AM   Modules accepted: Orders

## 2023-11-21 NOTE — Telephone Encounter (Signed)
Miguel w/ [employer] helped me call pt to reschedule ablation. AFib ablation has been r/s to 3/20. Pt aware and agreeable, understands we will call at a later date to go over instructions. CT order updated and message send to scheduler to r/s. Pt agreeable to plan and understands office will call him to review instructions.

## 2023-11-30 DIAGNOSIS — E785 Hyperlipidemia, unspecified: Secondary | ICD-10-CM | POA: Diagnosis not present

## 2023-11-30 DIAGNOSIS — I10 Essential (primary) hypertension: Secondary | ICD-10-CM | POA: Diagnosis not present

## 2023-12-01 DIAGNOSIS — G4733 Obstructive sleep apnea (adult) (pediatric): Secondary | ICD-10-CM | POA: Diagnosis not present

## 2023-12-09 DIAGNOSIS — R7303 Prediabetes: Secondary | ICD-10-CM | POA: Diagnosis not present

## 2023-12-09 DIAGNOSIS — E785 Hyperlipidemia, unspecified: Secondary | ICD-10-CM | POA: Diagnosis not present

## 2023-12-09 DIAGNOSIS — I10 Essential (primary) hypertension: Secondary | ICD-10-CM | POA: Diagnosis not present

## 2023-12-15 ENCOUNTER — Other Ambulatory Visit: Payer: Self-pay | Admitting: Cardiology

## 2023-12-15 DIAGNOSIS — R351 Nocturia: Secondary | ICD-10-CM | POA: Diagnosis not present

## 2023-12-15 DIAGNOSIS — C61 Malignant neoplasm of prostate: Secondary | ICD-10-CM | POA: Diagnosis not present

## 2023-12-15 DIAGNOSIS — I4891 Unspecified atrial fibrillation: Secondary | ICD-10-CM

## 2023-12-15 DIAGNOSIS — N529 Male erectile dysfunction, unspecified: Secondary | ICD-10-CM | POA: Diagnosis not present

## 2023-12-15 NOTE — Telephone Encounter (Signed)
Prescription refill request for Eliquis received. Indication: Afib  Last office visit: 11/10/23 ([place])  Scr: 0.67 (12/25/22)  Age: 77 Weight: 79.8kg  Appropriate dose. Refill sent.

## 2023-12-21 DIAGNOSIS — G4733 Obstructive sleep apnea (adult) (pediatric): Secondary | ICD-10-CM | POA: Diagnosis not present

## 2023-12-23 IMAGING — MR MR MRA CHEST W/ OR W/O CM
11 series · 16 of 16 positions shown · IV contrast (gadavist)
Comparison: None.

CLINICAL DATA: Tiny echo density seen in the left atrium on prior
echocardiogram

EXAM:
MRA CHEST WITH OR WITHOUT CONTRAST
TECHNIQUE: Angiographic images of the chest were obtained using MRA technique
without and with intravenous contrast.
CONTRAST:  7.5mL GADAVIST GADOBUTROL 1 MMOL/ML IV SOLN

[Series 4: bSSFP · axial · 8.0mm · 0.86mm/px · 1 of 31 slices shown (1 of 2)]
[im 1/31]
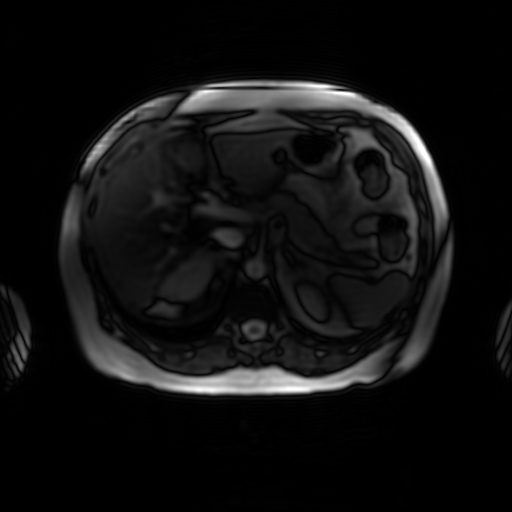

[Series 5: T1 · axial · 8.0mm · 0.82mm/px · 1 of 28 slices shown]
[im 1/28]
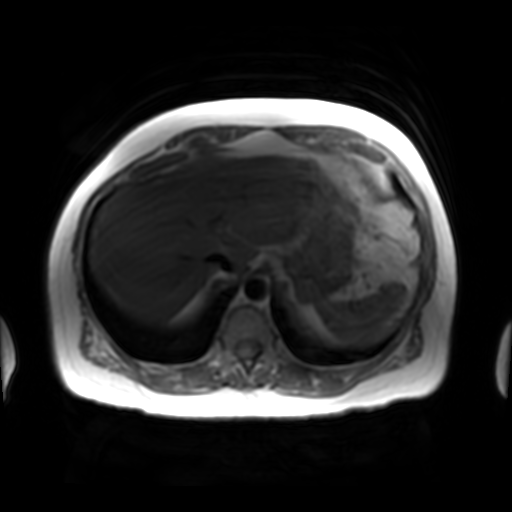

[Series 6: bSSFP · sagittal · 8.0mm · 0.82mm/px · 6 of 257 slices shown (2 of 2)]
[im 1/257]
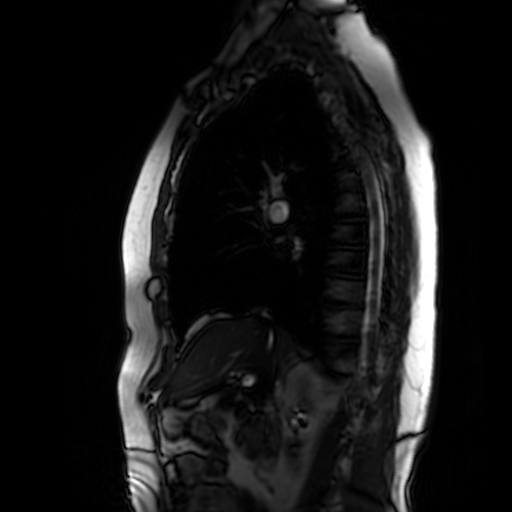
[im 52/257]
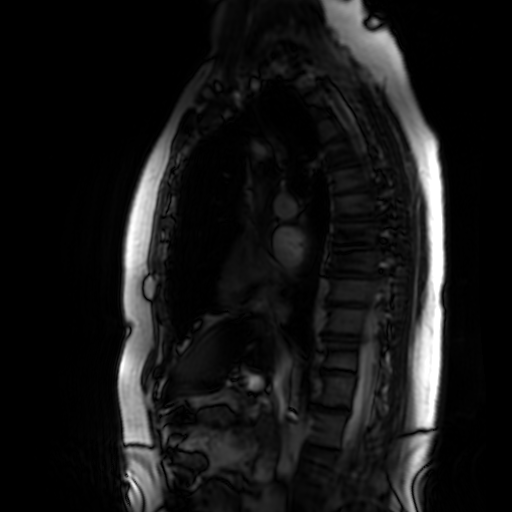
[im 103/257]
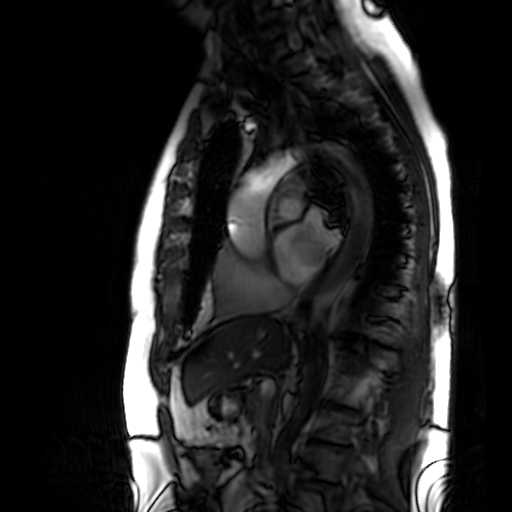
[im 154/257]
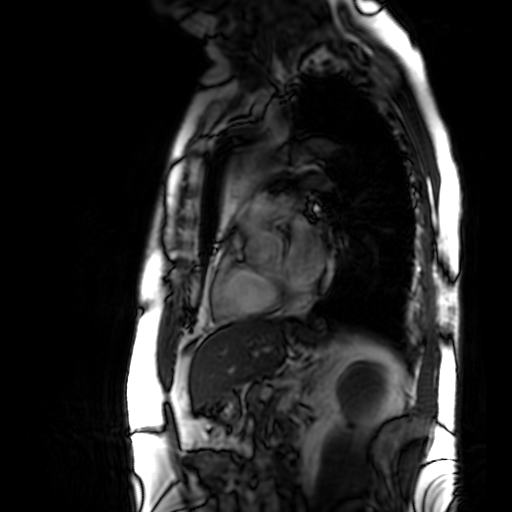
[im 205/257]
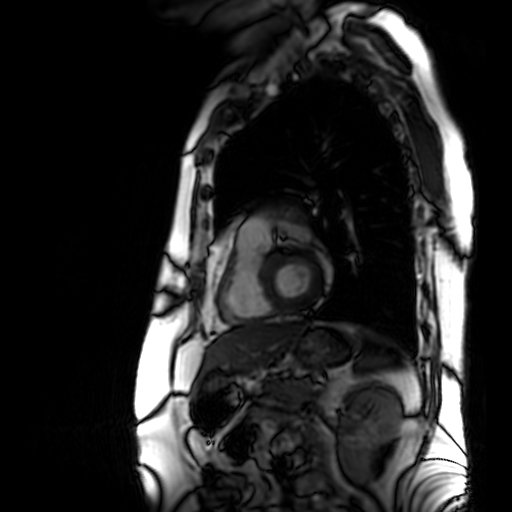
[im 257/257]
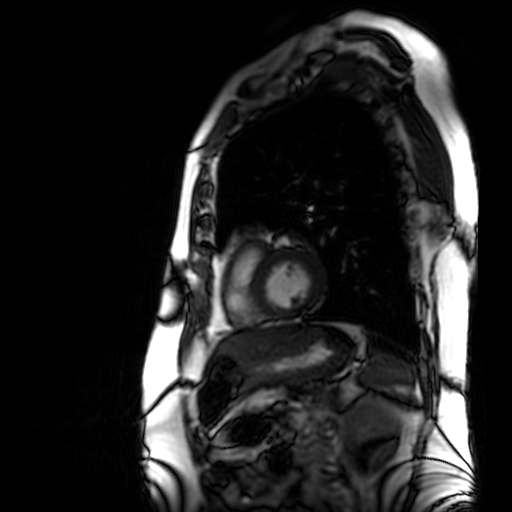

[Series 7: T1 dynamic · axial · 5.0mm · 0.86mm/px · 1 of 56 slices shown (1 of 2)]
[im 1/56]
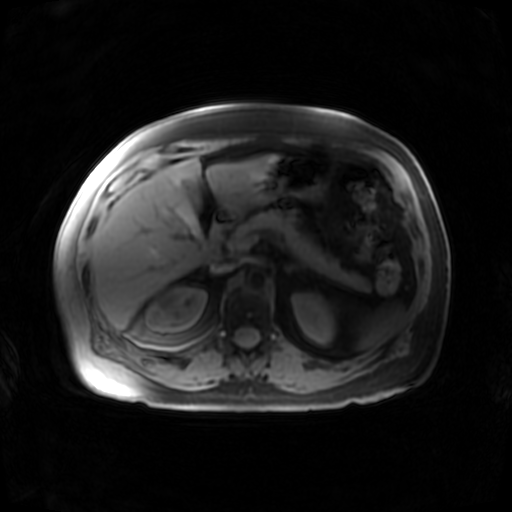

[Series 8: T1 dynamic · coronal · 4.0mm · 0.78mm/px · 1 of 48 slices shown (2 of 2)]
[im 1/48]
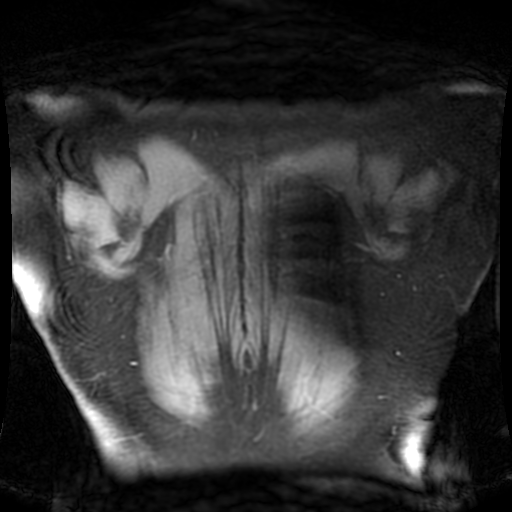

[Series 10: T1 dynamic post-contrast · axial · 5.0mm · 0.86mm/px · 1 of 56 slices shown (1 of 2)]
[im 1/56]
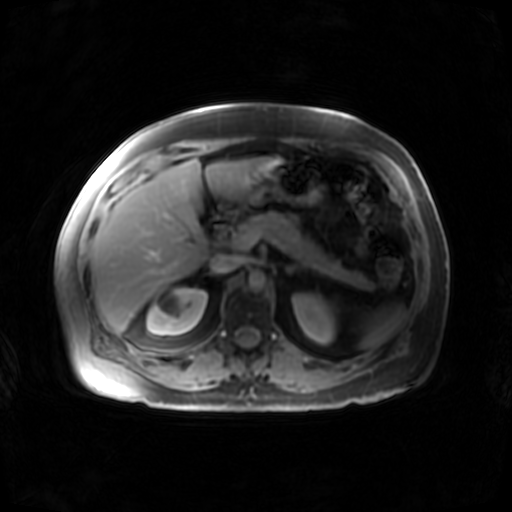

[Series 11: T1 dynamic post-contrast · coronal · 4.0mm · 0.78mm/px · 1 of 48 slices shown (2 of 2)]
[im 1/48]
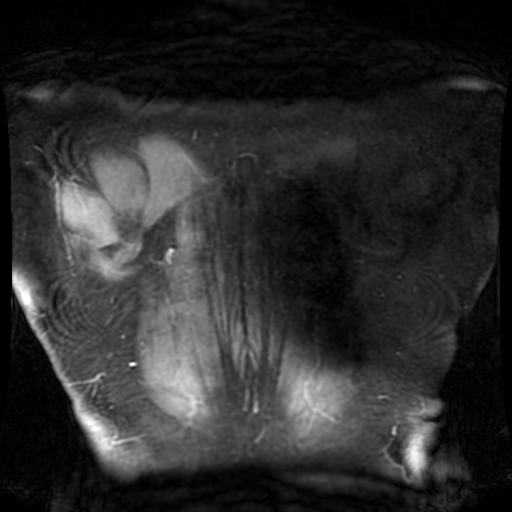

[Series 901: ph1/cemra ft mph · sagittal · 3.0mm · 0.78mm/px · 1 of 52 slices shown]
[im 1/52]
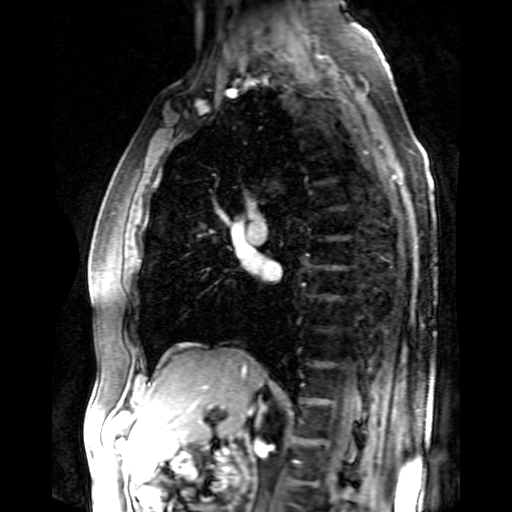

[Series 902: ph2/cemra ft mph · sagittal · 3.0mm · 0.78mm/px · 1 of 52 slices shown]
[im 1/52]
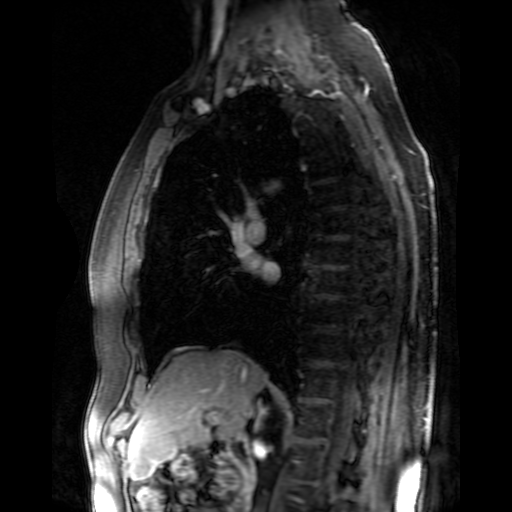

[((date))-((date)) · sagittal · 3.0mm · 0.78mm/px · 1 of 49 slices shown (1 of 2)]
[im 1/49]
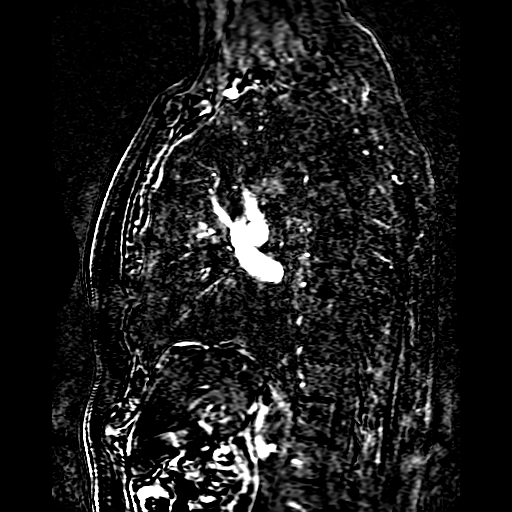

[((date))-((date)) · sagittal · 3.0mm · 0.78mm/px · 1 of 52 slices shown (2 of 2)]
[im 1/52]
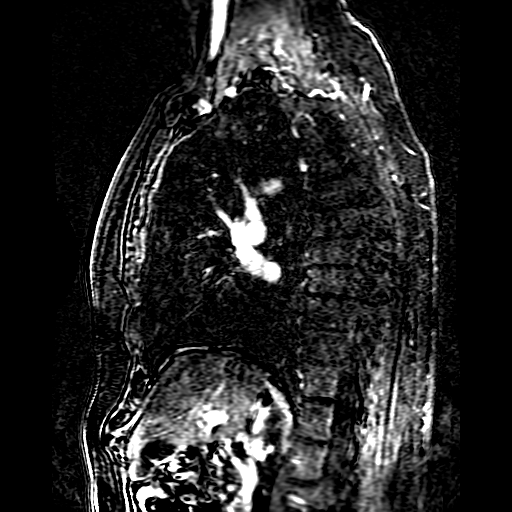

[16 of 16 positions shown; findings below may reference images not displayed]

FINDINGS: VASCULAR

Aorta: Normal caliber thoracic aorta with no stenosis. Standard 3
vessel aortic arch.

Heart: Normal heart size with no pericardial effusion. No evidence
of left atrial mass, although study is not optimized for assessment
of the cardiac chambers, and a very small lesion may not be visible.

Pulmonary Arteries: Normal caliber main pulmonary artery with no
suspicious filling defects.Common trunk the right pulmonary.

Other: None.

NON-VASCULAR

Spinal cord: Unremarkable.

Brachial plexus: Visualized portions are unremarkable.

Muscles and tendons: Unremarkable.

Bones: No aggressive appearing osseous lesions.

Joints: Unremarkable.

No focal lung opacity. Partially imaged upper demonstrates a
partially imaged lesion T1 hypointense of the upper pole of the
right kidney with no definite enhancement.
IMPRESSION: VASCULAR

1. Normal caliber thoracic aorta.
2. Within exam limitations, no evidence of left atrial mass.

NON-VASCULAR

1. Partially imaged lesion of the upper pole of the right kidney
which is likely a simple cyst. Recommend renal ultrasound for
further evaluation.

## 2023-12-26 ENCOUNTER — Other Ambulatory Visit (HOSPITAL_COMMUNITY): Payer: Medicare HMO

## 2023-12-31 DIAGNOSIS — E7841 Elevated Lipoprotein(a): Secondary | ICD-10-CM

## 2023-12-31 DIAGNOSIS — I10 Essential (primary) hypertension: Secondary | ICD-10-CM | POA: Diagnosis not present

## 2023-12-31 DIAGNOSIS — E785 Hyperlipidemia, unspecified: Secondary | ICD-10-CM | POA: Diagnosis not present

## 2023-12-31 HISTORY — DX: Elevated lipoprotein(a): E78.41

## 2024-01-21 DIAGNOSIS — G4733 Obstructive sleep apnea (adult) (pediatric): Secondary | ICD-10-CM | POA: Diagnosis not present

## 2024-01-22 DIAGNOSIS — J069 Acute upper respiratory infection, unspecified: Secondary | ICD-10-CM | POA: Diagnosis not present

## 2024-01-22 DIAGNOSIS — R059 Cough, unspecified: Secondary | ICD-10-CM | POA: Diagnosis not present

## 2024-01-22 DIAGNOSIS — Z20822 Contact with and (suspected) exposure to covid-19: Secondary | ICD-10-CM | POA: Diagnosis not present

## 2024-01-23 ENCOUNTER — Telehealth: Payer: Self-pay | Admitting: *Deleted

## 2024-01-23 NOTE — Telephone Encounter (Signed)
-----   Message from [facility] P sent at 01/22/2024  2:28 PM EST ----- Regarding: Flec Patient came by stating that he was unable to continue Flecainide 100 bid  due to feeling dizzy/vertigo and fatigue. Patient resumed previous dose of 50 mg bid, he said the sx's went away but heart sx's that was being manage by Dr. Elberta Fortis came back when he reduce the dose. I asked Dr. Kirtland Bouchard what is the next step and replied to forward this to you. I updated his dose of flecainide.

## 2024-01-23 NOTE — Telephone Encounter (Signed)
Spoke to pt using his friend as interpreter (pt requested his friend interpret). Pt states he is doing ok on current reduced dose of Flecainide.  Currently no afib issues. Pt will call the office if he has any problems r/t afib prior to his scheduled ablation in march.  Pt appreciates the follow up call.

## 2024-01-31 DIAGNOSIS — I10 Essential (primary) hypertension: Secondary | ICD-10-CM | POA: Diagnosis not present

## 2024-01-31 DIAGNOSIS — E785 Hyperlipidemia, unspecified: Secondary | ICD-10-CM | POA: Diagnosis not present

## 2024-02-17 ENCOUNTER — Telehealth: Payer: Self-pay

## 2024-02-17 ENCOUNTER — Other Ambulatory Visit: Payer: Self-pay

## 2024-02-17 DIAGNOSIS — I480 Paroxysmal atrial fibrillation: Secondary | ICD-10-CM

## 2024-02-17 DIAGNOSIS — I10 Essential (primary) hypertension: Secondary | ICD-10-CM

## 2024-02-17 DIAGNOSIS — I483 Typical atrial flutter: Secondary | ICD-10-CM

## 2024-02-17 NOTE — Telephone Encounter (Signed)
 Attempted to call patient through interpretor services in regards to upcoming CT scan and ablation. Unable to leave a voicemail.   Spanish CT procedure instructions have been mailed to the patient. We are waiting on Ablation instructions to be translated. They will be mailed out once returned from interpreting team.

## 2024-02-18 DIAGNOSIS — J320 Chronic maxillary sinusitis: Secondary | ICD-10-CM | POA: Diagnosis not present

## 2024-02-18 DIAGNOSIS — R55 Syncope and collapse: Secondary | ICD-10-CM | POA: Diagnosis not present

## 2024-02-18 DIAGNOSIS — R42 Dizziness and giddiness: Secondary | ICD-10-CM | POA: Diagnosis not present

## 2024-02-18 DIAGNOSIS — R270 Ataxia, unspecified: Secondary | ICD-10-CM | POA: Diagnosis not present

## 2024-02-18 LAB — LAB REPORT - SCANNED: EGFR: 96

## 2024-02-19 DIAGNOSIS — R42 Dizziness and giddiness: Secondary | ICD-10-CM | POA: Diagnosis not present

## 2024-02-19 DIAGNOSIS — Z7689 Persons encountering health services in other specified circumstances: Secondary | ICD-10-CM | POA: Diagnosis not present

## 2024-02-19 DIAGNOSIS — E871 Hypo-osmolality and hyponatremia: Secondary | ICD-10-CM | POA: Diagnosis not present

## 2024-02-19 DIAGNOSIS — J329 Chronic sinusitis, unspecified: Secondary | ICD-10-CM | POA: Diagnosis not present

## 2024-02-21 DIAGNOSIS — G4733 Obstructive sleep apnea (adult) (pediatric): Secondary | ICD-10-CM | POA: Diagnosis not present

## 2024-02-25 ENCOUNTER — Ambulatory Visit (HOSPITAL_COMMUNITY): Payer: 59

## 2024-02-27 ENCOUNTER — Encounter: Payer: Self-pay | Admitting: Cardiology

## 2024-02-27 ENCOUNTER — Ambulatory Visit: Payer: 59 | Attending: Cardiology | Admitting: Cardiology

## 2024-02-27 VITALS — BP 100/70 | HR 72 | Ht 66.0 in | Wt 168.0 lb

## 2024-02-27 DIAGNOSIS — I483 Typical atrial flutter: Secondary | ICD-10-CM

## 2024-02-27 DIAGNOSIS — R55 Syncope and collapse: Secondary | ICD-10-CM

## 2024-02-27 DIAGNOSIS — D6859 Other primary thrombophilia: Secondary | ICD-10-CM | POA: Diagnosis not present

## 2024-02-27 DIAGNOSIS — I10 Essential (primary) hypertension: Secondary | ICD-10-CM

## 2024-02-27 DIAGNOSIS — I480 Paroxysmal atrial fibrillation: Secondary | ICD-10-CM

## 2024-02-27 MED ORDER — METOPROLOL SUCCINATE ER 25 MG PO TB24: 25.0000 mg | ORAL_TABLET | Freq: Every day | ORAL | 1 refills | Status: DC

## 2024-02-27 NOTE — Progress Notes (Signed)
 Cardiology Office Note:  .   Date:  02/27/2024  ID:  Elray, Dains 1946/02/09, MRN [id number] PCP: [name], MD  [facility] Providers Cardiologist:  [name], MD Electrophysiologist:  Regan Lemming, MD    History of Present Illness: .   Maxwell Hopkins is a 78 y.o. male with a past medical history of hypertension, atrial flutter/PAF, OSA, dyslipidemia.  04/10/2023 monitor multiple episodes of atrial flutter and atrial fibrillation >> referred to EP 04/25/2023 echo EF 66 5%, mild LVH, RA moderately dilated, aortic valve sclerosis was present without stenosis 02/26/2022 cMRI to assess for possible left atrial mass--no mass, EF 64%, no LGE 11/15/2021 Lexiscan no ischemia   Evaluated by Dr. Bing Matter on 10/21/2023, stable from a general cardiology perspective and advised to follow-up in 6 months. Evaluated by Dr. Elberta Fortis on 11/10/2023, he was bothered by side effects of flecainide, referred to an alternate rhythm control strategy and the plan was made to proceed with an atrial fibrillation ablation.  Most recently he was evaluated emergency department on 02/18/2024 following a fall, no loss of consciousness, was felt to be attributed to orthostasis, was advised to follow-up with cardiology outpatient.  He presents today for follow-up after recent near syncopal event at [facility].  He states he has been bothered by dizziness lately, most notably in the morning.  We reconciled his medication list, he is taking his Flomax in the evening, and only 1 tablet of metoprolol daily.  He is staying well-hydrated. He denies chest pain, palpitations, dyspnea, pnd, orthopnea, n, v, syncope, edema, weight gain, or early satiety.   ROS: Review of Systems  Musculoskeletal:  Positive for falls.  Neurological:  Positive for dizziness.  All other systems reviewed and are negative.    Studies Reviewed: .        Cardiac Studies & Procedures    ______________________________________________________________________________________________   STRESS TESTS  MYOCARDIAL PERFUSION IMAGING 11/15/2021  Narrative   Findings are consistent with no ischemia and no prior myocardial infarction. The study is low risk.   No ST deviation was noted.   Left ventricular function is normal. Nuclear stress EF: 75 %. The left ventricular ejection fraction is hyperdynamic (>65%). End diastolic cavity size is normal.   ECHOCARDIOGRAM  ECHOCARDIOGRAM COMPLETE 04/25/2023  Narrative ECHOCARDIOGRAM REPORT    Patient Name:   Maxwell Hopkins Date of Exam: 04/25/2023 Medical Rec #:  [id number]        Height:       66.0 in Accession #:    [id number]       Weight:       173.0 lb Date of Birth:  Jun 07, 1946        BSA:          1.880 m Patient Age:    76 years         BP:           110/70 mmHg Patient Gender: M                HR:           95 bpm. Exam Location:  [facility]  Procedure: 2D Echo, Cardiac Doppler and Color Doppler  Indications:    Dyspnea on exertion [R06.09 (ICD-10-CM)]  History:        Patient has prior history of Echocardiogram examinations, most recent 10/25/2021. Arrythmias:Atrial Fibrillation; Risk Factors:Dyslipidemia and Hypertension.  Sonographer:    Margreta Journey RDCS Referring Phys: [id number] Georgeanna Lea   Sonographer  Comments: Global longitudinal strain was attempted. IMPRESSIONS   1. Left ventricular ejection fraction, by estimation, is 60 to 65%. The left ventricle has normal function. The left ventricle has no regional wall motion abnormalities. There is mild left ventricular hypertrophy. Left ventricular diastolic parameters are indeterminate. 2. Right ventricular systolic function is normal. The right ventricular size is normal. There is normal pulmonary artery systolic pressure. 3. Right atrial size was moderately dilated. 4. The mitral valve is normal in structure. Trivial mitral valve regurgitation. No  evidence of mitral stenosis. 5. The aortic valve is normal in structure. Aortic valve regurgitation is not visualized. Aortic valve sclerosis/calcification is present, without any evidence of aortic stenosis. 6. The inferior vena cava is normal in size with greater than 50% respiratory variability, suggesting right atrial pressure of 3 mmHg.  FINDINGS Left Ventricle: Left ventricular ejection fraction, by estimation, is 60 to 65%. The left ventricle has normal function. The left ventricle has no regional wall motion abnormalities. The left ventricular internal cavity size was normal in size. There is mild left ventricular hypertrophy. Left ventricular diastolic parameters are indeterminate.  Right Ventricle: The right ventricular size is normal. No increase in right ventricular wall thickness. Right ventricular systolic function is normal. There is normal pulmonary artery systolic pressure. The tricuspid regurgitant velocity is 1.88 m/s, and with an assumed right atrial pressure of 3 mmHg, the estimated right ventricular systolic pressure is 17.1 mmHg.  Left Atrium: Left atrial size was normal in size.  Right Atrium: Right atrial size was moderately dilated.  Pericardium: There is no evidence of pericardial effusion.  Mitral Valve: The mitral valve is normal in structure. Trivial mitral valve regurgitation. No evidence of mitral valve stenosis.  Tricuspid Valve: The tricuspid valve is normal in structure. Tricuspid valve regurgitation is mild . No evidence of tricuspid stenosis.  Aortic Valve: The aortic valve is normal in structure. Aortic valve regurgitation is not visualized. Aortic valve sclerosis/calcification is present, without any evidence of aortic stenosis.  Pulmonic Valve: The pulmonic valve was normal in structure. Pulmonic valve regurgitation is not visualized. No evidence of pulmonic stenosis.  Aorta: The aortic root is normal in size and structure.  Venous: The inferior vena  cava is normal in size with greater than 50% respiratory variability, suggesting right atrial pressure of 3 mmHg.  IAS/Shunts: No atrial level shunt detected by color flow Doppler.   LEFT VENTRICLE PLAX 2D LVIDd:         3.80 cm   Diastology LVIDs:         2.00 cm   LV e' medial:    20.40 cm/s LV PW:         1.10 cm   LV E/e' medial:  4.5 LV IVS:        1.20 cm   LV e' lateral:   22.90 cm/s LVOT diam:     2.10 cm   LV E/e' lateral: 4.0 LV SV:         71 LV SV Index:   38 LVOT Area:     3.46 cm   RIGHT VENTRICLE             IVC RV Basal diam:  4.70 cm     IVC diam: 1.90 cm RV Mid diam:    3.80 cm RV S prime:     18.20 cm/s TAPSE (M-mode): 2.5 cm  LEFT ATRIUM             Index  RIGHT ATRIUM           Index LA diam:        4.00 cm 2.13 cm/m   RA Area:     28.20 cm LA Vol (A2C):   47.5 ml 25.26 ml/m  RA Volume:   98.30 ml  52.28 ml/m LA Vol (A4C):   58.0 ml 30.85 ml/m LA Biplane Vol: 55.7 ml 29.62 ml/m AORTIC VALVE LVOT Vmax:   91.60 cm/s LVOT Vmean:  70.400 cm/s LVOT VTI:    0.206 m  AORTA Ao Root diam: 3.50 cm Ao Asc diam:  3.20 cm Ao Desc diam: 1.90 cm  MITRAL VALVE               TRICUSPID VALVE MV Area (PHT): 3.60 cm    TR Peak grad:   14.1 mmHg MV Decel Time: 211 msec    TR Vmax:        188.00 cm/s MV E velocity: 91.30 cm/s SHUNTS Systemic VTI:  0.21 m Systemic Diam: 2.10 cm  [name] MD Electronically signed by [name] MD Signature Date/Time: 04/25/2023/12:04:53 PM    Final    MONITORS  LONG TERM MONITOR (3-14 DAYS) 05/06/2023  Narrative Patch Wear Time:  13 days and 23 hours (2024-04-11T13:56:44-0400 to 2024-04-25T13:56:36-0400)  Patient had a min HR of 43 bpm, max HR of 179 bpm, and avg HR of 83 bpm. Predominant underlying rhythm was Sinus Rhythm. Atrial Fibrillation/Flutter occurred (35% burden), ranging from 43-179 bpm (avg of 103 bpm), the longest lasting 18 hours 15 mins with an avg rate of 105 bpm. 2 Pauses occurred,  the longest lasting 3.1 secs (19 bpm). Atrial Fibrillation/Flutter was detected within +/- 45 seconds of symptomatic patient event(s). Isolated SVEs were occasional (2.4%, 40427), SVE Couplets were rare (<1.0%, 355), and no SVE Triplets were present. Isolated VEs were rare (<1.0%), and no VE Couplets or VE Triplets were present.  Summary and conclusions: Multiple episode of atrial flutter/fibrillation total burden 35% average heart rate of 103 longest duration 18 hours 15 minutes. 2 pauses occurred longest 3.1 seconds. Atrial fibrillation/flutter with symptomatic     CARDIAC MRI  MR CARDIAC MORPHOLOGY W WO CONTRAST 02/26/2022  Narrative CLINICAL DATA:  Possible left atrial mass on echo  EXAM: CARDIAC MRI  TECHNIQUE: The patient was scanned on a 1.5 Tesla Siemens magnet. A dedicated cardiac coil was used. Functional imaging was done using Fiesta sequences. 2,3, and 4 chamber views were done to assess for RWMA's. Modified Simpson's rule using a short axis stack was used to calculate an ejection fraction on a dedicated work [employer]. The patient received 10 cc of Gadavist. After 10 minutes inversion recovery sequences were used to assess for infiltration and scar tissue.  CONTRAST:  10 cc  of Gadavist  FINDINGS: Left ventricle:  -Normal wall thickness  -Normal size  -Normal systolic function  -Normal ECV (27%)  -No LGE  LV EF: 64% (Normal 56-78%)  Absolute volumes:  LV EDV: 93mL (Normal 77-195 mL)  LV ESV: 33mL (Normal 19-72 mL)  LV SV: 60mL (Normal 51-133 mL)  CO: 4.3L/min (Normal 2.8-8.8 L/min)  Indexed volumes:  LV EDV: 29mL/sq-m (Normal 47-92 mL/sq-m)  LV ESV: 10mL/sq-m (Normal 13-30 mL/sq-m)  LV SV: 23mL/sq-m (Normal 32-62 mL/sq-m)  CI: 2.2L/min/sq-m (Normal 1.7-4.2 L/min/sq-m)  Right ventricle: Normal size and systolic function  RV EF:  58% (Normal 47-74%)  Absolute volumes:  RV EDV: (Normal 88-227 mL)  RV ESV:  44mL (Normal 23-103 mL)  RV SV: 62mL (Normal 52-138 mL)  CO: 4.4L/min (Normal 2.8-8.8 L/min)  Indexed volumes:  RV EDV: 40mL/sq-m (Normal 55-105 mL/sq-m)  RV ESV: 34mL/sq-m (Normal 15-43 mL/sq-m)  RV SV: 8mL/sq-m (Normal 32-64 mL/sq-m)  CI: 2.3L/min/sq-m (Normal 1.7-4.2 L/min/sq-m)  Left atrium: Mild enlargement.  No mass seen.  Right atrium: Mild enlargement  Mitral valve: Trivial regurgitation  Aortic valve: No regurgitation  Tricuspid valve: Trivial regurgitation  Pulmonic valve: No regurgitation  Aorta: Normal proximal ascending aorta  Pericardium: Normal  IMPRESSION: 1.  No left atrial mass seen  2.  Normal LV size, wall thickness, and systolic function (EF 64%)  3.  Normal RV size and systolic function (EF 58%)  4.  No late gadolinium enhancement to suggest myocardial scar   Electronically Signed By: Epifanio Lesches M.D. On: 02/26/2022 22:02   ______________________________________________________________________________________________      Risk Assessment/Calculations:    CHA2DS2-VASc Score = 3   This indicates a 3.2% annual risk of stroke. The patient's score is based upon: CHF History: 0 HTN History: 1 Diabetes History: 0 Stroke History: 0 Vascular Disease History: 0 Age Score: 2 Gender Score: 0            Physical Exam:   VS:  BP 100/70 (BP Location: Right Arm, Patient Position: Sitting, Cuff Size: Normal)   Pulse 72   Ht 5\' 6"  (1.676 m)   Wt 168 lb (76.2 kg)   SpO2 93%   BMI 27.12 kg/m    Wt Readings from Last 3 Encounters:  02/27/24 168 lb (76.2 kg)  11/10/23 176 lb (79.8 kg)  10/21/23 178 lb (80.7 kg)    GEN: Well nourished, well developed in no acute distress NECK: No JVD; No carotid bruits CARDIAC: RRR, no murmurs, rubs, gallops RESPIRATORY:  Clear to auscultation without rales, wheezing or rhonchi  ABDOMEN: Soft, non-tender, non-distended EXTREMITIES:  No edema; No deformity   ASSESSMENT AND PLAN: .   Near  syncope, dizziness-this is most likely associated with orthostasis.  His blood pressure is on the low normal side today at 100/70.  Most likely it is attributed to his use of Flomax.  He is taking this in the evening.  We will adjust his metoprolol dose to half tablet once daily, so 25 mg each day.  Encouraged him to purchase compression socks and wear them when he is out of bed, change positions slowly, ensure he is staying adequately hydrated.  Paroxysmal atrial fibrillation/hypercoagulable state-on high risk medication use-he is currently maintaining sinus rhythm, CHA2DS2-VASc score is 1. EKG reviewed from his ED visit, QRS remains narrow.  He has an ablation scheduled for 03/18/2024.  Continue flecainide 50 mg twice daily, continue metoprolol but we will decrease this dose to 25 mg daily, continue Eliquis 5 mg twice daily no indication for dose reduction.       Dispo: CBC, BMET in preparation for his upcoming ablation.  Decrease metoprolol to 25 mg once daily.  Follow-up with general cardiology in 3 months.  Signed, Flossie Dibble, NP

## 2024-02-27 NOTE — Patient Instructions (Addendum)
 Wear compression socks     Medication Instructions:   Decrease your metoprolol to 25 mg once a day.  *If you need a refill on your cardiac medications before your next appointment, please call your pharmacy*   Lab Work: Your physician recommends that you return for lab work in: one day next week  You need to have labs done when you are fasting.  You can come Monday through Friday 8:30 am to 12:00 pm and 1:15 to 4:30. You do not need to make an appointment as the order has already been placed. The labs you are going to have done are BMET, CBC. If you have labs (blood work) drawn today and your tests are completely normal, you will receive your results only by: MyChart Message (if you have MyChart) OR A paper copy in the mail If you have any lab test that is abnormal or we need to change your treatment, we will call you to review the results.   Testing/Procedures: None Ordered   Follow-Up: At [facility], you and your health needs are our priority.  As part of our continuing mission to provide you with exceptional heart care, we have created designated Provider Care Teams.  These Care Teams include your primary Cardiologist (physician) and Advanced Practice Providers (APPs -  Physician Assistants and Nurse Practitioners) who all work together to provide you with the care you need, when you need it.  We recommend signing up for the patient portal called "MyChart".  Sign up information is provided on this After Visit Summary.  MyChart is used to connect with patients for Virtual Visits (Telemedicine).  Patients are able to view lab/test results, encounter notes, upcoming appointments, etc.  Non-urgent messages can be sent to your provider as well.   To learn more about what you can do with MyChart, go to [email or web address].    Your next appointment:   3 month follow up

## 2024-02-28 DIAGNOSIS — I10 Essential (primary) hypertension: Secondary | ICD-10-CM | POA: Diagnosis not present

## 2024-02-28 DIAGNOSIS — E785 Hyperlipidemia, unspecified: Secondary | ICD-10-CM | POA: Diagnosis not present

## 2024-03-01 ENCOUNTER — Other Ambulatory Visit: Payer: Self-pay | Admitting: Cardiology

## 2024-03-01 ENCOUNTER — Telehealth (HOSPITAL_COMMUNITY): Payer: Self-pay

## 2024-03-01 DIAGNOSIS — I10 Essential (primary) hypertension: Secondary | ICD-10-CM | POA: Diagnosis not present

## 2024-03-01 DIAGNOSIS — I480 Paroxysmal atrial fibrillation: Secondary | ICD-10-CM | POA: Diagnosis not present

## 2024-03-01 DIAGNOSIS — I483 Typical atrial flutter: Secondary | ICD-10-CM | POA: Diagnosis not present

## 2024-03-01 NOTE — Telephone Encounter (Signed)
 Spoke with patient via interpreter, Maxwell Hopkins to complete pre-procedure call.     Patient reports he went to [facility] ED on 2/19 for dizziness and told it was most likely due to low blood pressure. He followed up with cardiology on 2/28 and advised to decrease Metoprolol to 25 mg daily. He reports symptoms are improving.   Pre-procedure testing scheduled: CT on 03/09/24 and lab work on today, 03/01/24.  Confirmed patient is taking Eliquis and will continue taking medication before procedure or it may need to be rescheduled.  Confirmed patient is scheduled for Atrial Fibrillation Ablation on Thursday, March 20 with Dr. Loman Brooklyn. Instructed patient to arrive at the Main Entrance A at [facility]: [address] [place], [place] [address] and check in at Admitting at 10:30 AM  Advised of plan to go home the same day and will only stay overnight if medically necessary. You MUST have a responsible adult to drive you home and MUST be with you the first 24 hours after you arrive home or your procedure could be cancelled.  Patient verbalized understanding to information provided and is agreeable to proceed with procedure.

## 2024-03-02 LAB — CBC
Hematocrit: 38.6 % (ref 37.5–51.0)
Hemoglobin: 13 g/dL (ref 13.0–17.7)
MCH: 34 pg — ABNORMAL HIGH (ref 26.6–33.0)
MCHC: 33.7 g/dL (ref 31.5–35.7)
MCV: 101 fL — ABNORMAL HIGH (ref 79–97)
Platelets: 220 x10E3/uL (ref 150–450)
RBC: 3.82 x10E6/uL — ABNORMAL LOW (ref 4.14–5.80)
RDW: 12.9 % (ref 11.6–15.4)
WBC: 5.1 x10E3/uL (ref 3.4–10.8)

## 2024-03-02 LAB — BASIC METABOLIC PANEL WITH GFR
BUN/Creatinine Ratio: 25 — ABNORMAL HIGH (ref 10–24)
BUN: 21 mg/dL (ref 8–27)
CO2: 23 mmol/L (ref 20–29)
Calcium: 9.1 mg/dL (ref 8.6–10.2)
Chloride: 98 mmol/L (ref 96–106)
Creatinine, Ser: 0.84 mg/dL (ref 0.76–1.27)
Glucose: 102 mg/dL — ABNORMAL HIGH (ref 70–99)
Potassium: 4.2 mmol/L (ref 3.5–5.2)
Sodium: 138 mmol/L (ref 134–144)
eGFR: 91 mL/min/1.73

## 2024-03-03 ENCOUNTER — Telehealth: Payer: Self-pay

## 2024-03-03 NOTE — Telephone Encounter (Signed)
-----   Message from Nurse Glenetta Hew sent at 03/03/2024  9:53 AM EST -----  ----- Message ----- From: Flossie Dibble, NP Sent: 03/02/2024   1:42 PM EST To: Lonia Farber, RN  Stable CBC without anemia or infection. Electrolytes are normal. Good result for upcoming ablation.

## 2024-03-03 NOTE — Telephone Encounter (Signed)
 Patient notified of results and verbalized understanding.

## 2024-03-09 ENCOUNTER — Ambulatory Visit (HOSPITAL_COMMUNITY)
Admission: RE | Admit: 2024-03-09 | Discharge: 2024-03-09 | Disposition: A | Discharge status: Home / Self Care | Payer: 59 | Source: Ambulatory Visit | Attending: Cardiology | Admitting: Cardiology

## 2024-03-09 DIAGNOSIS — I480 Paroxysmal atrial fibrillation: Secondary | ICD-10-CM | POA: Insufficient documentation

## 2024-03-09 MED ORDER — IOHEXOL 350 MG/ML SOLN
100.0000 mL | Freq: Once | INTRAVENOUS | Status: AC | PRN
Start: 2024-03-09 — End: 2024-03-09
  Administered 2024-03-09: 100 mL via INTRAVENOUS

## 2024-03-10 ENCOUNTER — Encounter: Payer: Self-pay | Admitting: Cardiology

## 2024-03-10 ENCOUNTER — Telehealth: Payer: Self-pay | Admitting: *Deleted

## 2024-03-10 DIAGNOSIS — I2510 Atherosclerotic heart disease of native coronary artery without angina pectoris: Secondary | ICD-10-CM

## 2024-03-10 DIAGNOSIS — Q2112 Patent foramen ovale: Secondary | ICD-10-CM | POA: Insufficient documentation

## 2024-03-10 HISTORY — DX: Patent foramen ovale: Q21.12

## 2024-03-10 HISTORY — DX: Atherosclerotic heart disease of native coronary artery without angina pectoris: I25.10

## 2024-03-10 NOTE — H&P (View-Only) (Signed)
 Cardiology Office Note:  .   Date:  03/11/2024  ID:  [name], [name] 1946/04/18, MRN [id number] PCP: Maxwell Rakes, MD  [facility] Providers Cardiologist:  Maxwell Balsam, MD Electrophysiologist:  Maxwell Lemming, MD    History of Present Illness: .   Maxwell Hopkins is a 78 y.o. male with a past medical history of CAD per coronary CTA, hypertension, atrial flutter/PAF, OSA, dyslipidemia, PFO.  03/10/2024 coronary CTA calcium score of 1961, 96th percentile, moderate left main stenosis, proximal LAD with severe mixed density stenosis, left circumflex 100% occluded, small PFO 04/10/2023 monitor multiple episodes of atrial flutter and atrial fibrillation >> referred to EP 04/25/2023 echo EF 60-65%, mild LVH, RA moderately dilated, aortic valve sclerosis was present without stenosis 02/26/2022 cMRI to assess for possible left atrial mass--no mass, EF 64%, no LGE 11/15/2021 Lexiscan no ischemia   Evaluated by Dr. Bing Hopkins on 10/21/2023, stable from a general cardiology perspective and advised to follow-up in 6 months. Evaluated by Dr. Elberta Hopkins on 11/10/2023, he was bothered by side effects of flecainide, referred to an alternate rhythm control strategy and the plan was made to proceed with an atrial fibrillation ablation.  Most recently he was evaluated emergency department on 02/18/2024 following a fall, no loss of consciousness, was felt to be attributed to orthostasis, was advised to follow-up with cardiology outpatient.  Evaluated by myself on 02/27/2024 following his recent near syncopal event.  His blood pressure was low normal, we decreased his metoprolol, encouraged hydration.  He had upcoming plans for ablation of his atrial fibrillation, with plans to follow-up with general cardiology in 3 months.  Underwent coronary CT of his heart in preparation for atrial fibrillation ablation, there was a calcium score of 1961, 96 percentile, proximal LAD with severe mixed density  stenosis, left circumflex appeared to be occluded in the mid segment.  His atrial fibrillation ablation has been canceled, flecainide discontinued, follow-up arranged for following day.  He presents today accompanied by medical interpreter for follow-up after his abnormal coronary CTA.  He states occasionally he has some chest discomfort, it is hard for him to describe what it feels like, not clear if this is new since his last time he was evaluated as he previously denied chest pain.  It does not appear to be anginal in nature--states it can occur while he is resting, sharp.  He has not had any further near syncopal episodes, wearing compression socks and staying well-hydrated.  We discussed at length his abnormal coronary CTA, he understands what is going on, the need to cancel his ablation and proceed with coronary angiography to determine severity of vessel disease. He denies chest pain, palpitations, dyspnea, pnd, orthopnea, n, v, dizziness, syncope, edema, weight gain, or early satiety.    ROS: Review of Systems  All other systems reviewed and are negative.    Studies Reviewed: Marland Kitchen   EKG Interpretation Date/Time:  Thursday March 11 2024 08:40:01 EDT Ventricular Rate:  81 PR Interval:  182 QRS Duration:  78 QT Interval:  376 QTC Calculation: 436 R Axis:   6  Text Interpretation: Normal sinus rhythm Possible Left atrial enlargement Borderline ECG When compared with ECG of 21-Oct-2023 09:04, PR interval has decreased Incomplete right bundle branch block is no longer Present Confirmed by Maxwell Hopkins [phone]) on 03/11/2024 1:24:18 PM    Cardiac Studies & Procedures   ______________________________________________________________________________________________   STRESS TESTS  MYOCARDIAL PERFUSION IMAGING 11/15/2021  Narrative   Findings are consistent with no ischemia  and no prior myocardial infarction. The study is low risk.   No ST deviation was noted.   Left ventricular function  is normal. Nuclear stress EF: 75 %. The left ventricular ejection fraction is hyperdynamic (>65%). End diastolic cavity size is normal.   ECHOCARDIOGRAM  ECHOCARDIOGRAM COMPLETE 04/25/2023  Narrative ECHOCARDIOGRAM REPORT    Patient Name:   Maxwell Hopkins Date of Exam: 04/25/2023 Medical Rec #:  [id number]        Height:       66.0 in Accession #:    [id number]       Weight:       173.0 lb Date of Birth:  11-01-1946        BSA:          1.880 m Patient Age:    76 years         BP:           110/70 mmHg Patient Gender: M                HR:           95 bpm. Exam Location:  [facility]  Procedure: 2D Echo, Cardiac Doppler and Color Doppler  Indications:    Dyspnea on exertion [R06.09 (ICD-10-CM)]  History:        Patient has prior history of Echocardiogram examinations, most recent 10/25/2021. Arrythmias:Atrial Fibrillation; Risk Factors:Dyslipidemia and Hypertension.  Sonographer:    Maxwell Hopkins RDCS Referring Phys: [id number] Maxwell Hopkins   Sonographer Comments: Global longitudinal strain was attempted. IMPRESSIONS   1. Left ventricular ejection fraction, by estimation, is 60 to 65%. The left ventricle has normal function. The left ventricle has no regional wall motion abnormalities. There is mild left ventricular hypertrophy. Left ventricular diastolic parameters are indeterminate. 2. Right ventricular systolic function is normal. The right ventricular size is normal. There is normal pulmonary artery systolic pressure. 3. Right atrial size was moderately dilated. 4. The mitral valve is normal in structure. Trivial mitral valve regurgitation. No evidence of mitral stenosis. 5. The aortic valve is normal in structure. Aortic valve regurgitation is not visualized. Aortic valve sclerosis/calcification is present, without any evidence of aortic stenosis. 6. The inferior vena cava is normal in size with greater than 50% respiratory variability, suggesting right atrial pressure  of 3 mmHg.  FINDINGS Left Ventricle: Left ventricular ejection fraction, by estimation, is 60 to 65%. The left ventricle has normal function. The left ventricle has no regional wall motion abnormalities. The left ventricular internal cavity size was normal in size. There is mild left ventricular hypertrophy. Left ventricular diastolic parameters are indeterminate.  Right Ventricle: The right ventricular size is normal. No increase in right ventricular wall thickness. Right ventricular systolic function is normal. There is normal pulmonary artery systolic pressure. The tricuspid regurgitant velocity is 1.88 m/s, and with an assumed right atrial pressure of 3 mmHg, the estimated right ventricular systolic pressure is 17.1 mmHg.  Left Atrium: Left atrial size was normal in size.  Right Atrium: Right atrial size was moderately dilated.  Pericardium: There is no evidence of pericardial effusion.  Mitral Valve: The mitral valve is normal in structure. Trivial mitral valve regurgitation. No evidence of mitral valve stenosis.  Tricuspid Valve: The tricuspid valve is normal in structure. Tricuspid valve regurgitation is mild . No evidence of tricuspid stenosis.  Aortic Valve: The aortic valve is normal in structure. Aortic valve regurgitation is not visualized. Aortic valve sclerosis/calcification is present, without any evidence of aortic stenosis.  Pulmonic Valve: The pulmonic valve was normal in structure. Pulmonic valve regurgitation is not visualized. No evidence of pulmonic stenosis.  Aorta: The aortic root is normal in size and structure.  Venous: The inferior vena cava is normal in size with greater than 50% respiratory variability, suggesting right atrial pressure of 3 mmHg.  IAS/Shunts: No atrial level shunt detected by color flow Doppler.   LEFT VENTRICLE PLAX 2D LVIDd:         3.80 cm   Diastology LVIDs:         2.00 cm   LV e' medial:    20.40 cm/s LV PW:         1.10 cm   LV  E/e' medial:  4.5 LV IVS:        1.20 cm   LV e' lateral:   22.90 cm/s LVOT diam:     2.10 cm   LV E/e' lateral: 4.0 LV SV:         71 LV SV Index:   38 LVOT Area:     3.46 cm   RIGHT VENTRICLE             IVC RV Basal diam:  4.70 cm     IVC diam: 1.90 cm RV Mid diam:    3.80 cm RV S prime:     18.20 cm/s TAPSE (M-mode): 2.5 cm  LEFT ATRIUM             Index        RIGHT ATRIUM           Index LA diam:        4.00 cm 2.13 cm/m   RA Area:     28.20 cm LA Vol (A2C):   47.5 ml 25.26 ml/m  RA Volume:   98.30 ml  52.28 ml/m LA Vol (A4C):   58.0 ml 30.85 ml/m LA Biplane Vol: 55.7 ml 29.62 ml/m AORTIC VALVE LVOT Vmax:   91.60 cm/s LVOT Vmean:  70.400 cm/s LVOT VTI:    0.206 m  AORTA Ao Root diam: 3.50 cm Ao Asc diam:  3.20 cm Ao Desc diam: 1.90 cm  MITRAL VALVE               TRICUSPID VALVE MV Area (PHT): 3.60 cm    TR Peak grad:   14.1 mmHg MV Decel Time: 211 msec    TR Vmax:        188.00 cm/s MV E velocity: 91.30 cm/s SHUNTS Systemic VTI:  0.21 m Systemic Diam: 2.10 cm  Maxwell Balsam MD Electronically signed by Maxwell Balsam MD Signature Date/Time: 04/25/2023/12:04:53 PM    Final    MONITORS  LONG TERM MONITOR (3-14 DAYS) 05/06/2023  Narrative Patch Wear Time:  13 days and 23 hours (2024-04-11T13:56:44-0400 to 2024-04-25T13:56:36-0400)  Patient had a min HR of 43 bpm, max HR of 179 bpm, and avg HR of 83 bpm. Predominant underlying rhythm was Sinus Rhythm. Atrial Fibrillation/Flutter occurred (35% burden), ranging from 43-179 bpm (avg of 103 bpm), the longest lasting 18 hours 15 mins with an avg rate of 105 bpm. 2 Pauses occurred, the longest lasting 3.1 secs (19 bpm). Atrial Fibrillation/Flutter was detected within +/- 45 seconds of symptomatic patient event(s). Isolated SVEs were occasional (2.4%, 40427), SVE Couplets were rare (<1.0%, 355), and no SVE Triplets were present. Isolated VEs were rare (<1.0%), and no VE Couplets or VE Triplets were  present.  Summary and conclusions: Multiple episode of atrial flutter/fibrillation total burden 35%  average heart rate of 103 longest duration 18 hours 15 minutes. 2 pauses occurred longest 3.1 seconds. Atrial fibrillation/flutter with symptomatic     CARDIAC MRI  MR CARDIAC MORPHOLOGY W WO CONTRAST 02/26/2022  Narrative CLINICAL DATA:  Possible left atrial mass on echo  EXAM: CARDIAC MRI  TECHNIQUE: The patient was scanned on a 1.5 Tesla Siemens magnet. A dedicated cardiac coil was used. Functional imaging was done using Fiesta sequences. 2,3, and 4 chamber views were done to assess for RWMA's. Modified Simpson's rule using a short axis stack was used to calculate an ejection fraction on a dedicated work [employer]. The patient received 10 cc of Gadavist. After 10 minutes inversion recovery sequences were used to assess for infiltration and scar tissue.  CONTRAST:  10 cc  of Gadavist  FINDINGS: Left ventricle:  -Normal wall thickness  -Normal size  -Normal systolic function  -Normal ECV (27%)  -No LGE  LV EF: 64% (Normal 56-78%)  Absolute volumes:  LV EDV: 93mL (Normal 77-195 mL)  LV ESV: 33mL (Normal 19-72 mL)  LV SV: 60mL (Normal 51-133 mL)  CO: 4.3L/min (Normal 2.8-8.8 L/min)  Indexed volumes:  LV EDV: 56mL/sq-m (Normal 47-92 mL/sq-m)  LV ESV: 28mL/sq-m (Normal 13-30 mL/sq-m)  LV SV: 66mL/sq-m (Normal 32-62 mL/sq-m)  CI: 2.2L/min/sq-m (Normal 1.7-4.2 L/min/sq-m)  Right ventricle: Normal size and systolic function  RV EF:  58% (Normal 47-74%)  Absolute volumes:  RV EDV: (Normal 88-227 mL)  RV ESV: 44mL (Normal 23-103 mL)  RV SV: 62mL (Normal 52-138 mL)  CO: 4.4L/min (Normal 2.8-8.8 L/min)  Indexed volumes:  RV EDV: 65mL/sq-m (Normal 55-105 mL/sq-m)  RV ESV: 9mL/sq-m (Normal 15-43 mL/sq-m)  RV SV: 72mL/sq-m (Normal 32-64 mL/sq-m)  CI: 2.3L/min/sq-m (Normal 1.7-4.2 L/min/sq-m)  Left atrium: Mild  enlargement.  No mass seen.  Right atrium: Mild enlargement  Mitral valve: Trivial regurgitation  Aortic valve: No regurgitation  Tricuspid valve: Trivial regurgitation  Pulmonic valve: No regurgitation  Aorta: Normal proximal ascending aorta  Pericardium: Normal  IMPRESSION: 1.  No left atrial mass seen  2.  Normal LV size, wall thickness, and systolic function (EF 64%)  3.  Normal RV size and systolic function (EF 58%)  4.  No late gadolinium enhancement to suggest myocardial scar   Electronically Signed By: Epifanio Lesches M.D. On: 02/26/2022 22:02   ______________________________________________________________________________________________      Risk Assessment/Calculations:    CHA2DS2-VASc Score = 3   This indicates a 3.2% annual risk of stroke. The patient's score is based upon: CHF History: 0 HTN History: 1 Diabetes History: 0 Stroke History: 0 Vascular Disease History: 0 Age Score: 2 Gender Score: 0            Physical Exam:   VS:  BP 120/70   Pulse 81   Ht 5\' 6"  (1.676 m)   Wt 167 lb (75.8 kg)   SpO2 93%   BMI 26.95 kg/m    Wt Readings from Last 3 Encounters:  03/11/24 167 lb (75.8 kg)  02/27/24 168 lb (76.2 kg)  11/10/23 176 lb (79.8 kg)    GEN: Well nourished, well developed in no acute distress NECK: No JVD; No carotid bruits CARDIAC: RRR, no murmurs, rubs, gallops RESPIRATORY:  Clear to auscultation without rales, wheezing or rhonchi  ABDOMEN: Soft, non-tender, non-distended EXTREMITIES:  No edema; No deformity   ASSESSMENT AND PLAN: .   Coronary artery disease-calcium score of 1961, 96 percentile, moderate left main stenosis, proximal LAD with severe mixed  density stenosis, CTO of left circumflex.  He is currently without angina.  Will arrange for nitroglycerin, discussed how to use this, as well as when to present to the emergency department.  Discussed risk versus benefit of left heart catheterization, answered his  questions to his satisfaction.  Discussed with DOD Dr. Dulce Sellar, he also joined him on the visit and discussed further testing with a left heart catheterization.  Patient agrees and would like to proceed with left heart cath.  BMET on 03/01/2024 revealed creatinine 0.84, GFR 91.   Hypertension-blood pressure is well-controlled at 120/70, continue metoprolol 25 mg daily.  Dyslipidemia-appears to be monitored formally by his PCP, we just obtain lab work 2 weeks ago so we are not checking any lab work today.  It does not.  He has had LP(a) checked either, we will address this at his follow-up visit.  Currently on Lipitor 40 mg daily.  Paroxysmal atrial fibrillation/hypercoagulable state-CHA2DS2-VASc score of 3, he is maintaining sinus rhythm, previously on flecainide but this has been discontinued in light of his heart disease.  Previously was being evaluated for atrial fibrillation ablation however this has been put on hold so we can further evaluate his coronary artery disease.  Will have him hold his Eliquis after Saturday 03/15/24 in preparation for his left heart cath.          Dispo: Nitroglycerin as needed, proceed with left heart cath, follow-up with general cardiology in 3 weeks.  Informed Consent   Shared Decision Making/Informed Consent The risks [stroke (1 in 1000), death (1 in 1000), kidney failure [usually temporary] (1 in 500), bleeding (1 in 200), allergic reaction [possibly serious] (1 in 200)], benefits (diagnostic support and management of coronary artery disease) and alternatives of a cardiac catheterization were discussed in detail with Mr. Lefferts and he is willing to proceed.     Signed, Flossie Dibble, NP

## 2024-03-10 NOTE — Telephone Encounter (Addendum)
 Called pt, using interpreter services.  Pt informed of abn CT result findings.  Made aware ablation being cancelled for next week.  Advised pt to stop Flecainide. Scheduled pt to see Wallis Bamberg, NP (spoke to her first) tomorrow morning to discuss stat heart catheterization.  Pt aware I will follow up at later date about when we could schedule him to see Dr. Elberta Fortis again.  I did not cancel his 6/18 three month post ablation follow up with Dr. Elberta Fortis. Patient verbalized understanding and agreeable to plan.

## 2024-03-10 NOTE — Progress Notes (Signed)
 Cardiology Office Note:  .   Date:  03/11/2024  ID:  Maxwell Hopkins, Maxwell Hopkins 1946/04/18, MRN [id number] PCP: [name], MD  [facility] Providers Cardiologist:  [name], MD Electrophysiologist:  Regan Lemming, MD    History of Present Illness: .   Maxwell Hopkins is a 78 y.o. male with a past medical history of CAD per coronary CTA, hypertension, atrial flutter/PAF, OSA, dyslipidemia, PFO.  03/10/2024 coronary CTA calcium score of 1961, 96th percentile, moderate left main stenosis, proximal LAD with severe mixed density stenosis, left circumflex 100% occluded, small PFO 04/10/2023 monitor multiple episodes of atrial flutter and atrial fibrillation >> referred to EP 04/25/2023 echo EF 60-65%, mild LVH, RA moderately dilated, aortic valve sclerosis was present without stenosis 02/26/2022 cMRI to assess for possible left atrial mass--no mass, EF 64%, no LGE 11/15/2021 Lexiscan no ischemia   Evaluated by Dr. Bing Matter on 10/21/2023, stable from a general cardiology perspective and advised to follow-up in 6 months. Evaluated by Dr. Elberta Fortis on 11/10/2023, he was bothered by side effects of flecainide, referred to an alternate rhythm control strategy and the plan was made to proceed with an atrial fibrillation ablation.  Most recently he was evaluated emergency department on 02/18/2024 following a fall, no loss of consciousness, was felt to be attributed to orthostasis, was advised to follow-up with cardiology outpatient.  Evaluated by myself on 02/27/2024 following his recent near syncopal event.  His blood pressure was low normal, we decreased his metoprolol, encouraged hydration.  He had upcoming plans for ablation of his atrial fibrillation, with plans to follow-up with general cardiology in 3 months.  Underwent coronary CT of his heart in preparation for atrial fibrillation ablation, there was a calcium score of 1961, 96 percentile, proximal LAD with severe mixed density  stenosis, left circumflex appeared to be occluded in the mid segment.  His atrial fibrillation ablation has been canceled, flecainide discontinued, follow-up arranged for following day.  He presents today accompanied by medical interpreter for follow-up after his abnormal coronary CTA.  He states occasionally he has some chest discomfort, it is hard for him to describe what it feels like, not clear if this is new since his last time he was evaluated as he previously denied chest pain.  It does not appear to be anginal in nature--states it can occur while he is resting, sharp.  He has not had any further near syncopal episodes, wearing compression socks and staying well-hydrated.  We discussed at length his abnormal coronary CTA, he understands what is going on, the need to cancel his ablation and proceed with coronary angiography to determine severity of vessel disease. He denies chest pain, palpitations, dyspnea, pnd, orthopnea, n, v, dizziness, syncope, edema, weight gain, or early satiety.    ROS: Review of Systems  All other systems reviewed and are negative.    Studies Reviewed: Marland Kitchen   EKG Interpretation Date/Time:  Thursday March 11 2024 08:40:01 EDT Ventricular Rate:  81 PR Interval:  182 QRS Duration:  78 QT Interval:  376 QTC Calculation: 436 R Axis:   6  Text Interpretation: Normal sinus rhythm Possible Left atrial enlargement Borderline ECG When compared with ECG of 21-Oct-2023 09:04, PR interval has decreased Incomplete right bundle branch block is no longer Present Confirmed by Wallis Bamberg [phone]) on 03/11/2024 1:24:18 PM    Cardiac Studies & Procedures   ______________________________________________________________________________________________   STRESS TESTS  MYOCARDIAL PERFUSION IMAGING 11/15/2021  Narrative   Findings are consistent with no ischemia  and no prior myocardial infarction. The study is low risk.   No ST deviation was noted.   Left ventricular function  is normal. Nuclear stress EF: 75 %. The left ventricular ejection fraction is hyperdynamic (>65%). End diastolic cavity size is normal.   ECHOCARDIOGRAM  ECHOCARDIOGRAM COMPLETE 04/25/2023  Narrative ECHOCARDIOGRAM REPORT    Patient Name:   Maxwell Hopkins Date of Exam: 04/25/2023 Medical Rec #:  [id number]        Height:       66.0 in Accession #:    [id number]       Weight:       173.0 lb Date of Birth:  11-01-1946        BSA:          1.880 m Patient Age:    76 years         BP:           110/70 mmHg Patient Gender: M                HR:           95 bpm. Exam Location:  [facility]  Procedure: 2D Echo, Cardiac Doppler and Color Doppler  Indications:    Dyspnea on exertion [R06.09 (ICD-10-CM)]  History:        Patient has prior history of Echocardiogram examinations, most recent 10/25/2021. Arrythmias:Atrial Fibrillation; Risk Factors:Dyslipidemia and Hypertension.  Sonographer:    Margreta Journey RDCS Referring Phys: [id number] Georgeanna Lea   Sonographer Comments: Global longitudinal strain was attempted. IMPRESSIONS   1. Left ventricular ejection fraction, by estimation, is 60 to 65%. The left ventricle has normal function. The left ventricle has no regional wall motion abnormalities. There is mild left ventricular hypertrophy. Left ventricular diastolic parameters are indeterminate. 2. Right ventricular systolic function is normal. The right ventricular size is normal. There is normal pulmonary artery systolic pressure. 3. Right atrial size was moderately dilated. 4. The mitral valve is normal in structure. Trivial mitral valve regurgitation. No evidence of mitral stenosis. 5. The aortic valve is normal in structure. Aortic valve regurgitation is not visualized. Aortic valve sclerosis/calcification is present, without any evidence of aortic stenosis. 6. The inferior vena cava is normal in size with greater than 50% respiratory variability, suggesting right atrial pressure  of 3 mmHg.  FINDINGS Left Ventricle: Left ventricular ejection fraction, by estimation, is 60 to 65%. The left ventricle has normal function. The left ventricle has no regional wall motion abnormalities. The left ventricular internal cavity size was normal in size. There is mild left ventricular hypertrophy. Left ventricular diastolic parameters are indeterminate.  Right Ventricle: The right ventricular size is normal. No increase in right ventricular wall thickness. Right ventricular systolic function is normal. There is normal pulmonary artery systolic pressure. The tricuspid regurgitant velocity is 1.88 m/s, and with an assumed right atrial pressure of 3 mmHg, the estimated right ventricular systolic pressure is 17.1 mmHg.  Left Atrium: Left atrial size was normal in size.  Right Atrium: Right atrial size was moderately dilated.  Pericardium: There is no evidence of pericardial effusion.  Mitral Valve: The mitral valve is normal in structure. Trivial mitral valve regurgitation. No evidence of mitral valve stenosis.  Tricuspid Valve: The tricuspid valve is normal in structure. Tricuspid valve regurgitation is mild . No evidence of tricuspid stenosis.  Aortic Valve: The aortic valve is normal in structure. Aortic valve regurgitation is not visualized. Aortic valve sclerosis/calcification is present, without any evidence of aortic stenosis.  Pulmonic Valve: The pulmonic valve was normal in structure. Pulmonic valve regurgitation is not visualized. No evidence of pulmonic stenosis.  Aorta: The aortic root is normal in size and structure.  Venous: The inferior vena cava is normal in size with greater than 50% respiratory variability, suggesting right atrial pressure of 3 mmHg.  IAS/Shunts: No atrial level shunt detected by color flow Doppler.   LEFT VENTRICLE PLAX 2D LVIDd:         3.80 cm   Diastology LVIDs:         2.00 cm   LV e' medial:    20.40 cm/s LV PW:         1.10 cm   LV  E/e' medial:  4.5 LV IVS:        1.20 cm   LV e' lateral:   22.90 cm/s LVOT diam:     2.10 cm   LV E/e' lateral: 4.0 LV SV:         71 LV SV Index:   38 LVOT Area:     3.46 cm   RIGHT VENTRICLE             IVC RV Basal diam:  4.70 cm     IVC diam: 1.90 cm RV Mid diam:    3.80 cm RV S prime:     18.20 cm/s TAPSE (M-mode): 2.5 cm  LEFT ATRIUM             Index        RIGHT ATRIUM           Index LA diam:        4.00 cm 2.13 cm/m   RA Area:     28.20 cm LA Vol (A2C):   47.5 ml 25.26 ml/m  RA Volume:   98.30 ml  52.28 ml/m LA Vol (A4C):   58.0 ml 30.85 ml/m LA Biplane Vol: 55.7 ml 29.62 ml/m AORTIC VALVE LVOT Vmax:   91.60 cm/s LVOT Vmean:  70.400 cm/s LVOT VTI:    0.206 m  AORTA Ao Root diam: 3.50 cm Ao Asc diam:  3.20 cm Ao Desc diam: 1.90 cm  MITRAL VALVE               TRICUSPID VALVE MV Area (PHT): 3.60 cm    TR Peak grad:   14.1 mmHg MV Decel Time: 211 msec    TR Vmax:        188.00 cm/s MV E velocity: 91.30 cm/s SHUNTS Systemic VTI:  0.21 m Systemic Diam: 2.10 cm  [name] MD Electronically signed by [name] MD Signature Date/Time: 04/25/2023/12:04:53 PM    Final    MONITORS  LONG TERM MONITOR (3-14 DAYS) 05/06/2023  Narrative Patch Wear Time:  13 days and 23 hours (2024-04-11T13:56:44-0400 to 2024-04-25T13:56:36-0400)  Patient had a min HR of 43 bpm, max HR of 179 bpm, and avg HR of 83 bpm. Predominant underlying rhythm was Sinus Rhythm. Atrial Fibrillation/Flutter occurred (35% burden), ranging from 43-179 bpm (avg of 103 bpm), the longest lasting 18 hours 15 mins with an avg rate of 105 bpm. 2 Pauses occurred, the longest lasting 3.1 secs (19 bpm). Atrial Fibrillation/Flutter was detected within +/- 45 seconds of symptomatic patient event(s). Isolated SVEs were occasional (2.4%, 40427), SVE Couplets were rare (<1.0%, 355), and no SVE Triplets were present. Isolated VEs were rare (<1.0%), and no VE Couplets or VE Triplets were  present.  Summary and conclusions: Multiple episode of atrial flutter/fibrillation total burden 35%  average heart rate of 103 longest duration 18 hours 15 minutes. 2 pauses occurred longest 3.1 seconds. Atrial fibrillation/flutter with symptomatic     CARDIAC MRI  MR CARDIAC MORPHOLOGY W WO CONTRAST 02/26/2022  Narrative CLINICAL DATA:  Possible left atrial mass on echo  EXAM: CARDIAC MRI  TECHNIQUE: The patient was scanned on a 1.5 Tesla Siemens magnet. A dedicated cardiac coil was used. Functional imaging was done using Fiesta sequences. 2,3, and 4 chamber views were done to assess for RWMA's. Modified Simpson's rule using a short axis stack was used to calculate an ejection fraction on a dedicated work [employer]. The patient received 10 cc of Gadavist. After 10 minutes inversion recovery sequences were used to assess for infiltration and scar tissue.  CONTRAST:  10 cc  of Gadavist  FINDINGS: Left ventricle:  -Normal wall thickness  -Normal size  -Normal systolic function  -Normal ECV (27%)  -No LGE  LV EF: 64% (Normal 56-78%)  Absolute volumes:  LV EDV: 93mL (Normal 77-195 mL)  LV ESV: 33mL (Normal 19-72 mL)  LV SV: 60mL (Normal 51-133 mL)  CO: 4.3L/min (Normal 2.8-8.8 L/min)  Indexed volumes:  LV EDV: 56mL/sq-m (Normal 47-92 mL/sq-m)  LV ESV: 28mL/sq-m (Normal 13-30 mL/sq-m)  LV SV: 66mL/sq-m (Normal 32-62 mL/sq-m)  CI: 2.2L/min/sq-m (Normal 1.7-4.2 L/min/sq-m)  Right ventricle: Normal size and systolic function  RV EF:  58% (Normal 47-74%)  Absolute volumes:  RV EDV: (Normal 88-227 mL)  RV ESV: 44mL (Normal 23-103 mL)  RV SV: 62mL (Normal 52-138 mL)  CO: 4.4L/min (Normal 2.8-8.8 L/min)  Indexed volumes:  RV EDV: 65mL/sq-m (Normal 55-105 mL/sq-m)  RV ESV: 9mL/sq-m (Normal 15-43 mL/sq-m)  RV SV: 72mL/sq-m (Normal 32-64 mL/sq-m)  CI: 2.3L/min/sq-m (Normal 1.7-4.2 L/min/sq-m)  Left atrium: Mild  enlargement.  No mass seen.  Right atrium: Mild enlargement  Mitral valve: Trivial regurgitation  Aortic valve: No regurgitation  Tricuspid valve: Trivial regurgitation  Pulmonic valve: No regurgitation  Aorta: Normal proximal ascending aorta  Pericardium: Normal  IMPRESSION: 1.  No left atrial mass seen  2.  Normal LV size, wall thickness, and systolic function (EF 64%)  3.  Normal RV size and systolic function (EF 58%)  4.  No late gadolinium enhancement to suggest myocardial scar   Electronically Signed By: Epifanio Lesches M.D. On: 02/26/2022 22:02   ______________________________________________________________________________________________      Risk Assessment/Calculations:    CHA2DS2-VASc Score = 3   This indicates a 3.2% annual risk of stroke. The patient's score is based upon: CHF History: 0 HTN History: 1 Diabetes History: 0 Stroke History: 0 Vascular Disease History: 0 Age Score: 2 Gender Score: 0            Physical Exam:   VS:  BP 120/70   Pulse 81   Ht 5\' 6"  (1.676 m)   Wt 167 lb (75.8 kg)   SpO2 93%   BMI 26.95 kg/m    Wt Readings from Last 3 Encounters:  03/11/24 167 lb (75.8 kg)  02/27/24 168 lb (76.2 kg)  11/10/23 176 lb (79.8 kg)    GEN: Well nourished, well developed in no acute distress NECK: No JVD; No carotid bruits CARDIAC: RRR, no murmurs, rubs, gallops RESPIRATORY:  Clear to auscultation without rales, wheezing or rhonchi  ABDOMEN: Soft, non-tender, non-distended EXTREMITIES:  No edema; No deformity   ASSESSMENT AND PLAN: .   Coronary artery disease-calcium score of 1961, 96 percentile, moderate left main stenosis, proximal LAD with severe mixed  density stenosis, CTO of left circumflex.  He is currently without angina.  Will arrange for nitroglycerin, discussed how to use this, as well as when to present to the emergency department.  Discussed risk versus benefit of left heart catheterization, answered his  questions to his satisfaction.  Discussed with DOD Dr. Dulce Sellar, he also joined him on the visit and discussed further testing with a left heart catheterization.  Patient agrees and would like to proceed with left heart cath.  BMET on 03/01/2024 revealed creatinine 0.84, GFR 91.   Hypertension-blood pressure is well-controlled at 120/70, continue metoprolol 25 mg daily.  Dyslipidemia-appears to be monitored formally by his PCP, we just obtain lab work 2 weeks ago so we are not checking any lab work today.  It does not.  He has had LP(a) checked either, we will address this at his follow-up visit.  Currently on Lipitor 40 mg daily.  Paroxysmal atrial fibrillation/hypercoagulable state-CHA2DS2-VASc score of 3, he is maintaining sinus rhythm, previously on flecainide but this has been discontinued in light of his heart disease.  Previously was being evaluated for atrial fibrillation ablation however this has been put on hold so we can further evaluate his coronary artery disease.  Will have him hold his Eliquis after Saturday 03/15/24 in preparation for his left heart cath.          Dispo: Nitroglycerin as needed, proceed with left heart cath, follow-up with general cardiology in 3 weeks.  Informed Consent   Shared Decision Making/Informed Consent The risks [stroke (1 in 1000), death (1 in 1000), kidney failure [usually temporary] (1 in 500), bleeding (1 in 200), allergic reaction [possibly serious] (1 in 200)], benefits (diagnostic support and management of coronary artery disease) and alternatives of a cardiac catheterization were discussed in detail with Mr. Lefferts and he is willing to proceed.     Signed, Flossie Dibble, NP

## 2024-03-11 ENCOUNTER — Encounter: Payer: Self-pay | Admitting: Cardiology

## 2024-03-11 ENCOUNTER — Ambulatory Visit: Attending: Cardiology | Admitting: Cardiology

## 2024-03-11 VITALS — BP 120/70 | HR 81 | Ht 66.0 in | Wt 167.0 lb

## 2024-03-11 DIAGNOSIS — E782 Mixed hyperlipidemia: Secondary | ICD-10-CM | POA: Diagnosis not present

## 2024-03-11 DIAGNOSIS — D6859 Other primary thrombophilia: Secondary | ICD-10-CM | POA: Diagnosis not present

## 2024-03-11 DIAGNOSIS — I10 Essential (primary) hypertension: Secondary | ICD-10-CM | POA: Diagnosis not present

## 2024-03-11 DIAGNOSIS — R931 Abnormal findings on diagnostic imaging of heart and coronary circulation: Secondary | ICD-10-CM

## 2024-03-11 DIAGNOSIS — I480 Paroxysmal atrial fibrillation: Secondary | ICD-10-CM | POA: Diagnosis not present

## 2024-03-11 DIAGNOSIS — I2510 Atherosclerotic heart disease of native coronary artery without angina pectoris: Secondary | ICD-10-CM

## 2024-03-11 MED ORDER — NITROGLYCERIN 0.4 MG SL SUBL: 0.4000 mg | SUBLINGUAL_TABLET | SUBLINGUAL | 6 refills | Status: AC | PRN

## 2024-03-11 NOTE — Patient Instructions (Signed)
 Medication Instructions:   Hold: Eliquis 2 days prior to Cath  START: Nitroglycerin:  Use nitroglycerin 1 tablet placed under the tongue at the first sign of chest pain or an angina attack. 1 tablet may be used every 5 minutes as needed, for up to 15 minutes. Do not take more than 3 tablets in 15 minutes. If pain persist call 911 or go to the nearest ED.     *If you need a refill on your cardiac medications before your next appointment, please call your pharmacy*  Lab Work: Completed 03-01-24  If you have labs (blood work) drawn today and your tests are completely normal, you will receive your results only by: MyChart Message (if you have MyChart) OR A paper copy in the mail If you have any lab test that is abnormal or we need to change your treatment, we will call you to review the results.   Testing/Procedures:  [place] [facility] [place] [address] [place] [address] Dept: [phone] Loc: [phone]  Maxwell Hopkins  03/11/2024  You are scheduled for a Cardiac Catheterization on Monday, March 17 with Dr. Nadara Eaton  1. Please arrive at the [facility] (Main Entrance A) at [facility]: [address] [place], [place] [address] at 7:00 AM (This time is 2 hour(s) before your procedure to ensure your preparation).   Free valet parking service is available. You will check in at ADMITTING. The support person will be asked to wait in the waiting room.  It is OK to have someone drop you off and come back when you are ready to be discharged.    Special note: Every effort is made to have your procedure done on time. Please understand that emergencies sometimes delay scheduled procedures.  2. Diet: Do not eat solid foods after midnight.  The patient may have clear liquids until 5am upon the day of the procedure.  3. Labs: Completed 03-01-24  4. Medication instructions in preparation for your  procedure:   Contrast Allergy: No   Stop taking Eliquis (Apixiban) on Saturday, March 17.   On the morning of your procedure, take your Aspirin 81 mg and any morning medicines NOT listed above.  You may use sips of water.  5. Plan to go home the same day, you will only stay overnight if medically necessary. 6. Bring a current list of your medications and current insurance cards. 7. You MUST have a responsible person to drive you home. 8. Someone MUST be with you the first 24 hours after you arrive home or your discharge will be delayed. 9. Please wear clothes that are easy to get on and off and wear slip-on shoes.  Thank you for allowing [place] to care for you!   -- [facility]    Follow-Up: At [facility], you and your health needs are our priority.  As part of our continuing mission to provide you with exceptional heart care, we have created designated Provider Care Teams.  These Care Teams include your primary Cardiologist (physician) and Advanced Practice Providers (APPs -  Physician Assistants and Nurse Practitioners) who all work together to provide you with the care you need, when you need it.  We recommend signing up for the patient portal called "MyChart".  Sign up information is provided on this After Visit Summary.  MyChart is used to connect with patients for Virtual Visits (Telemedicine).  Patients are  able to view lab/test results, encounter notes, upcoming appointments, etc.  Non-urgent messages can be sent to your provider as well.   To learn more about what you can do with MyChart, go to [email or web address].    Your next appointment:   3 week(s)  The format for your next appointment:   In Person  Provider:   Wallis Bamberg, NP Rosalita Levan)   Other Instructions  Coronary Angiogram With Stent Coronary angiogram with stent placement is a procedure to widen or open a narrow blood vessel of the heart (coronary artery). Arteries may  become blocked by cholesterol buildup (plaques) in the lining of the artery wall. When a coronary artery becomes partially blocked, blood flow to that area decreases. This may lead to chest pain or a heart attack (myocardial infarction). A stent is a small piece of metal that looks like mesh or spring. Stent placement may be done as treatment after a heart attack, or to prevent a heart attack if a blocked artery is found by a coronary angiogram. Let your health care provider know about: Any allergies you have, including allergies to medicines or contrast dye. All medicines you are taking, including vitamins, herbs, eye drops, creams, and over-the-counter medicines. Any problems you or family members have had with anesthetic medicines. Any blood disorders you have. Any surgeries you have had. Any medical conditions you have, including kidney problems or kidney failure. Whether you are pregnant or may be pregnant. Whether you are breastfeeding. What are the risks? Generally, this is a safe procedure. However, serious problems may occur, including: Damage to nearby structures or organs, such as the heart, blood vessels, or kidneys. A return of blockage. Bleeding, infection, or bruising at the insertion site. A collection of blood under the skin (hematoma) at the insertion site. A blood clot in another part of the body. Allergic reaction to medicines or dyes. Bleeding into the abdomen (retroperitoneal bleeding). Stroke (rare). Heart attack (rare). What happens before the procedure? Staying hydrated Follow instructions from your health care provider about hydration, which may include: Up to 2 hours before the procedure - you may continue to drink clear liquids, such as water, clear fruit juice, black coffee, and plain tea.    Eating and drinking restrictions Follow instructions from your health care provider about eating and drinking, which may include: 8 hours before the procedure - stop  eating heavy meals or foods, such as meat, fried foods, or fatty foods. 6 hours before the procedure - stop eating light meals or foods, such as toast or cereal. 2 hours before the procedure - stop drinking clear liquids. Medicines Ask your health care provider about: Changing or stopping your regular medicines. This is especially important if you are taking diabetes medicines or blood thinners. Taking medicines such as aspirin and ibuprofen. These medicines can thin your blood. Do not take these medicines unless your health care provider tells you to take them. Generally, aspirin is recommended before a thin tube, called a catheter, is passed through a blood vessel and inserted into the heart (cardiac catheterization). Taking over-the-counter medicines, vitamins, herbs, and supplements. General instructions Do not use any products that contain nicotine or tobacco for at least 4 weeks before the procedure. These products include cigarettes, e-cigarettes, and chewing tobacco. If you need help quitting, ask your health care provider. Plan to have someone take you home from the hospital or clinic. If you will be going home right after the procedure, plan to have someone with you for 24  hours. You may have tests and imaging procedures. Ask your health care provider: How your insertion site will be marked. Ask which artery will be used for the procedure. What steps will be taken to help prevent infection. These may include: Removing hair at the insertion site. Washing skin with a germ-killing soap. Taking antibiotic medicine. What happens during the procedure? An IV will be inserted into one of your veins. Electrodes may be placed on your chest to monitor your heart rate during the procedure. You will be given one or more of the following: A medicine to help you relax (sedative). A medicine to numb the area (local anesthetic) for catheter insertion. A small incision will be made for catheter  insertion. The catheter will be inserted into an artery using a guide wire. The location may be in your groin, your wrist, or the fold of your arm (near your elbow). An X-ray procedure (fluoroscopy) will be used to help guide the catheter to the opening of the heart arteries. A dye will be injected into the catheter. X-rays will be taken. The dye helps to show where any narrowing or blockages are located in the arteries. Tell your health care provider if you have chest pain or trouble breathing. A tiny wire will be guided to the blocked spot, and a balloon will be inflated to make the artery wider. The stent will be expanded to crush the plaques into the wall of the vessel. The stent will hold the area open and improve the blood flow. Most stents have a drug coating to reduce the risk of the stent narrowing over time. The artery may be made wider using a drill, laser, or other tools that remove plaques. The catheter will be removed when the blood flow improves. The stent will stay where it was placed, and the lining of the artery will grow over it. A bandage (dressing) will be placed on the insertion site. Pressure will be applied to stop bleeding. The IV will be removed. This procedure may vary among health care providers and hospitals.    What happens after the procedure? Your blood pressure, heart rate, breathing rate, and blood oxygen level will be monitored until you leave the hospital or clinic. If the procedure is done through the leg, you will lie flat in bed for a few hours or for as long as told by your health care provider. You will be instructed not to bend or cross your legs. The insertion site and the pulse in your foot or wrist will be checked often. You may have more blood tests, X-rays, and a test that records the electrical activity of your heart (electrocardiogram, or ECG). Do not drive for 24 hours if you were given a sedative during your procedure. Summary Coronary angiogram  with stent placement is a procedure to widen or open a narrowed coronary artery. This is done to treat heart problems. Before the procedure, let your health care provider know about all the medical conditions and surgeries you have or have had. This is a safe procedure. However, some problems may occur, including damage to nearby structures or organs, bleeding, blood clots, or allergies. Follow your health care provider's instructions about eating, drinking, medicines, and other lifestyle changes, such as quitting tobacco use before the procedure. This information is not intended to replace advice given to you by your health care provider. Make sure you discuss any questions you have with your health care provider. Document Revised: 07/07/2019 Document Reviewed: 07/07/2019 Elsevier Patient  Education  2021 [employer].

## 2024-03-15 ENCOUNTER — Ambulatory Visit (HOSPITAL_COMMUNITY)
Admission: RE | Admit: 2024-03-15 | Discharge: 2024-03-15 | Disposition: A | Discharge status: Home / Self Care | Attending: Cardiology | Admitting: Cardiology

## 2024-03-15 ENCOUNTER — Encounter (HOSPITAL_COMMUNITY): Payer: Self-pay | Admitting: Cardiology

## 2024-03-15 ENCOUNTER — Encounter (HOSPITAL_COMMUNITY)
Admission: RE | Disposition: A | Discharge status: Home / Self Care | Payer: Self-pay | Source: Home / Self Care | Attending: Cardiology

## 2024-03-15 ENCOUNTER — Other Ambulatory Visit: Payer: Self-pay

## 2024-03-15 DIAGNOSIS — R931 Abnormal findings on diagnostic imaging of heart and coronary circulation: Secondary | ICD-10-CM

## 2024-03-15 DIAGNOSIS — I4891 Unspecified atrial fibrillation: Secondary | ICD-10-CM

## 2024-03-15 DIAGNOSIS — Q2112 Patent foramen ovale: Secondary | ICD-10-CM | POA: Diagnosis not present

## 2024-03-15 DIAGNOSIS — I10 Essential (primary) hypertension: Secondary | ICD-10-CM | POA: Insufficient documentation

## 2024-03-15 DIAGNOSIS — I2582 Chronic total occlusion of coronary artery: Secondary | ICD-10-CM | POA: Diagnosis not present

## 2024-03-15 DIAGNOSIS — R9439 Abnormal result of other cardiovascular function study: Secondary | ICD-10-CM | POA: Diagnosis present

## 2024-03-15 DIAGNOSIS — I4892 Unspecified atrial flutter: Secondary | ICD-10-CM | POA: Diagnosis not present

## 2024-03-15 DIAGNOSIS — G4733 Obstructive sleep apnea (adult) (pediatric): Secondary | ICD-10-CM | POA: Insufficient documentation

## 2024-03-15 DIAGNOSIS — D6859 Other primary thrombophilia: Secondary | ICD-10-CM | POA: Insufficient documentation

## 2024-03-15 DIAGNOSIS — Z79899 Other long term (current) drug therapy: Secondary | ICD-10-CM | POA: Diagnosis not present

## 2024-03-15 DIAGNOSIS — I2584 Coronary atherosclerosis due to calcified coronary lesion: Secondary | ICD-10-CM | POA: Insufficient documentation

## 2024-03-15 DIAGNOSIS — I2510 Atherosclerotic heart disease of native coronary artery without angina pectoris: Secondary | ICD-10-CM | POA: Diagnosis not present

## 2024-03-15 DIAGNOSIS — I480 Paroxysmal atrial fibrillation: Secondary | ICD-10-CM | POA: Insufficient documentation

## 2024-03-15 DIAGNOSIS — E785 Hyperlipidemia, unspecified: Secondary | ICD-10-CM | POA: Diagnosis not present

## 2024-03-15 HISTORY — PX: LEFT HEART CATH AND CORONARY ANGIOGRAPHY: CATH118249

## 2024-03-15 HISTORY — PX: CORONARY PRESSURE/FFR WITH 3D MAPPING: CATH118309

## 2024-03-15 SURGERY — LEFT HEART CATH AND CORONARY ANGIOGRAPHY
Anesthesia: LOCAL

## 2024-03-15 MED ORDER — SODIUM CHLORIDE 0.9 % WEIGHT BASED INFUSION: 3.0000 mL/kg/h | INTRAVENOUS | Status: AC

## 2024-03-15 MED ORDER — IOHEXOL 350 MG/ML SOLN
INTRAVENOUS | Status: DC | PRN
  Administered 2024-03-15: 110 mL via INTRA_ARTERIAL

## 2024-03-15 MED ORDER — FENTANYL CITRATE (PF) 100 MCG/2ML IJ SOLN
INTRAMUSCULAR | Status: DC | PRN
  Administered 2024-03-15: 25 ug via INTRAVENOUS

## 2024-03-15 MED ORDER — APIXABAN 5 MG PO TABS
5.0000 mg | ORAL_TABLET | Freq: Two times a day (BID) | ORAL | Status: DC
Start: 2024-03-16 — End: 2024-06-10

## 2024-03-15 MED ORDER — HEPARIN SODIUM (PORCINE) 1000 UNIT/ML IJ SOLN
INTRAMUSCULAR | Status: AC
  Filled 2024-03-15: qty 10

## 2024-03-15 MED ORDER — LIDOCAINE HCL (PF) 1 % IJ SOLN
INTRAMUSCULAR | Status: AC
  Filled 2024-03-15: qty 30

## 2024-03-15 MED ORDER — VERAPAMIL HCL 2.5 MG/ML IV SOLN
INTRAVENOUS | Status: AC
  Filled 2024-03-15: qty 2

## 2024-03-15 MED ORDER — ASPIRIN 81 MG PO CHEW: 81.0000 mg | CHEWABLE_TABLET | ORAL | Status: DC

## 2024-03-15 MED ORDER — FENTANYL CITRATE (PF) 100 MCG/2ML IJ SOLN
INTRAMUSCULAR | Status: AC
  Filled 2024-03-15: qty 2

## 2024-03-15 MED ORDER — LIDOCAINE HCL (PF) 1 % IJ SOLN
INTRAMUSCULAR | Status: DC | PRN
  Administered 2024-03-15: 2 mL

## 2024-03-15 MED ORDER — SODIUM CHLORIDE 0.9 % IV SOLN
INTRAVENOUS | Status: DC | PRN
  Administered 2024-03-15: 10 mL/h via INTRAVENOUS

## 2024-03-15 MED ORDER — SODIUM CHLORIDE 0.9 % WEIGHT BASED INFUSION: 1.0000 mL/kg/h | INTRAVENOUS | Status: DC

## 2024-03-15 MED ORDER — MIDAZOLAM HCL 2 MG/2ML IJ SOLN
INTRAMUSCULAR | Status: DC | PRN
  Administered 2024-03-15: 2 mg via INTRAVENOUS

## 2024-03-15 MED ORDER — MIDAZOLAM HCL 2 MG/2ML IJ SOLN
INTRAMUSCULAR | Status: AC
Start: 2024-03-15 — End: ?
  Filled 2024-03-15: qty 2

## 2024-03-15 MED ORDER — HEPARIN (PORCINE) IN NACL 1000-0.9 UT/500ML-% IV SOLN
INTRAVENOUS | Status: DC | PRN
  Administered 2024-03-15 (×2): 500 mL

## 2024-03-15 MED ORDER — HEPARIN SODIUM (PORCINE) 1000 UNIT/ML IJ SOLN
INTRAMUSCULAR | Status: DC | PRN
  Administered 2024-03-15: 4000 [IU] via INTRAVENOUS

## 2024-03-15 MED ORDER — VERAPAMIL HCL 2.5 MG/ML IV SOLN
INTRAVENOUS | Status: DC | PRN
  Administered 2024-03-15: 10 mL via INTRA_ARTERIAL

## 2024-03-15 SURGICAL SUPPLY — 11 items
CARD KEY FFR CATHWORX (MISCELLANEOUS) IMPLANT
CATH INFINITI AMBI 5FR TG (CATHETERS) IMPLANT
DEVICE RAD COMP TR BAND LRG (VASCULAR PRODUCTS) IMPLANT
FFR CATHWORX KEY CARD (MISCELLANEOUS) ×1 IMPLANT
GLIDESHEATH SLEND A-KIT 6F 22G (SHEATH) IMPLANT
GUIDEWIRE ANGLED .035X150CM (WIRE) IMPLANT
GUIDEWIRE INQWIRE 1.5J.035X260 (WIRE) IMPLANT
INQWIRE 1.5J .035X260CM (WIRE) ×1 IMPLANT
KIT SINGLE USE MANIFOLD (KITS) IMPLANT
PACK CARDIAC CATHETERIZATION (CUSTOM PROCEDURE TRAY) ×1 IMPLANT
SET ATX-X65L (MISCELLANEOUS) IMPLANT

## 2024-03-15 NOTE — Discharge Instructions (Signed)

## 2024-03-15 NOTE — Interval H&P Note (Signed)
 History and Physical Interval Note:  03/15/2024 9:40 AM  Vashaun P Fukushima  has presented today for surgery, with the diagnosis of abnormal ct.  The various methods of treatment have been discussed with the patient and family. After consideration of risks, benefits and other options for treatment, the patient has consented to  Procedure(s): LEFT HEART CATH AND CORONARY ANGIOGRAPHY (N/A) and possible coronary angioplasty as a surgical intervention.  The patient's history has been reviewed, patient examined, no change in status, stable for surgery.  I have reviewed the patient's chart and labs.  Questions were answered to the patient's satisfaction.    Cath Lab Visit (complete for each Cath Lab visit)  Clinical Evaluation Leading to the Procedure:   ACS: No.  Non-ACS:    Anginal Classification: CCS III  Anti-ischemic medical therapy: Minimal Therapy (1 class of medications)  Non-Invasive Test Results: High-risk stress test findings: cardiac mortality >3%/year High risk Coronary CTA  Prior CABG: No previous CABG    Yates Decamp

## 2024-03-15 NOTE — Progress Notes (Signed)
 TR band removed at 1155, gauze dressing applied. Right radial level 0, clean, dry, and intact. Patient walked to the bathroom without difficulties.

## 2024-03-18 ENCOUNTER — Ambulatory Visit (HOSPITAL_COMMUNITY): Admit: 2024-03-18 | Payer: 59 | Admitting: Cardiology

## 2024-03-18 ENCOUNTER — Encounter (HOSPITAL_COMMUNITY): Payer: Self-pay

## 2024-03-18 SURGERY — ATRIAL FIBRILLATION ABLATION
Anesthesia: General

## 2024-03-20 DIAGNOSIS — G4733 Obstructive sleep apnea (adult) (pediatric): Secondary | ICD-10-CM | POA: Diagnosis not present

## 2024-03-30 DIAGNOSIS — E785 Hyperlipidemia, unspecified: Secondary | ICD-10-CM | POA: Diagnosis not present

## 2024-03-30 DIAGNOSIS — I10 Essential (primary) hypertension: Secondary | ICD-10-CM | POA: Diagnosis not present

## 2024-03-31 NOTE — Progress Notes (Unsigned)
 Cardiology Office Note:  .   Date:  04/01/2024  ID:  Maxwell Hopkins, Maxwell Hopkins 1946-03-08, MRN [id number] PCP: [name], MD  [facility] Providers Cardiologist:  [name], MD Electrophysiologist:  Regan Lemming, MD    History of Present Illness: .   Maxwell Hopkins is a 78 y.o. male with a past medical history of CAD per coronary CTA, hypertension, atrial flutter/PAF, OSA, dyslipidemia, PFO.  03/15/2024 left heart cath two-vessel significant disease CTO of the proximal to mid circumflex, diffuse disease to LAD but not hemodynamically significant 03/10/2024 coronary CTA calcium score of 1961, 96th percentile, moderate left main stenosis, proximal LAD with severe mixed density stenosis, left circumflex 100% occluded, small PFO 04/10/2023 monitor multiple episodes of atrial flutter and atrial fibrillation >> referred to EP 04/25/2023 echo EF 60-65%, mild LVH, RA moderately dilated, aortic valve sclerosis was present without stenosis 02/26/2022 cMRI to assess for possible left atrial mass--no mass, EF 64%, no LGE 11/15/2021 Lexiscan no ischemia   Evaluated by Dr. Bing Matter on 10/21/2023, stable from a general cardiology perspective and advised to follow-up in 6 months. Evaluated by Dr. Elberta Fortis on 11/10/2023, he was bothered by side effects of flecainide, referred to an alternate rhythm control strategy and the plan was made to proceed with an atrial fibrillation ablation.  Most recently he was evaluated emergency department on 02/18/2024 following a fall, no loss of consciousness, was felt to be attributed to orthostasis, was advised to follow-up with cardiology outpatient.  Evaluated by myself on 02/27/2024 following his recent near syncopal event.  His blood pressure was low normal, we decreased his metoprolol, encouraged hydration.  He had upcoming plans for ablation of his atrial fibrillation, with plans to follow-up with general cardiology in 3 months.  Underwent coronary CT  of his heart in preparation for atrial fibrillation ablation, there was a calcium score of 1961, 96 percentile, proximal LAD with severe mixed density stenosis, left circumflex appeared to be occluded in the mid segment.  His atrial fibrillation ablation has been canceled, flecainide discontinued, follow-up arranged for following day.  He underwent LHC on 03/15/2024 revealing CTO prox to mid CX, diffuse diseased LAD but not significant by FFR.   He presents today accompanied by medical interpreter for follow-up of his CAD.  He does not have any formal complaints from a cardiac perspective.  He is still bothered by intermittent dizziness, most noticeably associated with position change.  He also has tenderness to his left breast and he is not sure if he should move up his follow-up appointment with his PCP to address this.  He is not sure he wants to proceed with the ablation at this point, he has been keeping track of his blood pressure and his rhythm at home when he has not been bothered by palpitations. He denies chest pain, palpitations, dyspnea, pnd, orthopnea, n, v, dizziness, syncope, edema, weight gain, or early satiety.    ROS: Review of Systems  Neurological:  Positive for dizziness.  All other systems reviewed and are negative.    Studies Reviewed: .        Cardiac Studies & Procedures   ______________________________________________________________________________________________ CARDIAC CATHETERIZATION  CARDIAC CATHETERIZATION 03/15/2024  Narrative Images from the original result were not included.    Dist LAD lesion is 30% stenosed.  Left Heart Catheterization 03/15/24: Hemodynamic data: LVEDP 13 mmHg.  There is no pressure gradient across the aortic valve.  Angiographic data: LV: EF appears to be 55 to 60% with no regional  wall motion abnormality.  LM: Is mildly calcified.  Distal left main has a 20 to 30% stenosis.  LAD: Large-caliber vessel, severely diffusely diseased  all the way from the proximal to the mid segment.  There is tandem 60, 60 followed by 70% long stenosis after the origin of a moderate-sized D2.  D1 is small.  LAD otherwise is mildly diffusely calcified and in the mid to distal segment there is a focal 30% stenosis.  LAD virtual FFR by CathWorx: Proximal LAD 0.88 and distal LAD 0.86, not hemodynamically significant.  LCx: It is a moderate to large caliber vessel, it is moderately calcified in the proximal segment with diffuse 80% stenosis followed by occlusion after the origin of a very small OM1 followed by to moderate to large size marginals.  There are bridging collaterals and TIMI I-II flow in the distal CX.  The lesion appears fairly complex for PCI but can be attempted.  The mid CX   Impression and recommendations: Two-vessel significant disease involving occluded CTO proximal to mid CX which appears fairly complex for PCI but can be attempted.  Distal RCA is focal high-grade stenosis at the bifurcation of PDA and PL branch, amenable for PCI.  Diffusely diseased LAD in the proximal to prox-mid LAD with tandem 60 to 70% stenosis, not hemodynamically significant by virtual FFR.  Findings Coronary Findings Diagnostic  Dominance: Right  Left Main Ost LM to Mid LM lesion is 30% stenosed.  Left Anterior Descending Ost LAD to Prox LAD lesion is 60% stenosed. Mid LAD-1 lesion is 60% stenosed. Mid LAD-2 lesion is 70% stenosed. Dist LAD lesion is 30% stenosed.  Left Circumflex Collaterals Mid Cx filled by collaterals from Ost Cx.  Prox Cx to Mid Cx lesion is 100% stenosed.  First Obtuse Marginal Branch Vessel is small in size. Collaterals 1st Mrg filled by collaterals from Prox Cx.  Second Obtuse Marginal Branch Vessel is moderate in size.  Third Obtuse Marginal Branch Vessel is moderate in size.  Right Coronary Artery Prox RCA lesion is 50% stenosed. Dist RCA lesion is 95% stenosed.  Intervention  No interventions have  been documented.   STRESS TESTS  MYOCARDIAL PERFUSION IMAGING 11/15/2021  Narrative   Findings are consistent with no ischemia and no prior myocardial infarction. The study is low risk.   No ST deviation was noted.   Left ventricular function is normal. Nuclear stress EF: 75 %. The left ventricular ejection fraction is hyperdynamic (>65%). End diastolic cavity size is normal.   ECHOCARDIOGRAM  ECHOCARDIOGRAM COMPLETE 04/25/2023  Narrative ECHOCARDIOGRAM REPORT    Patient Name:   KADRIAN PARTCH Date of Exam: 04/25/2023 Medical Rec #:  [id number]        Height:       66.0 in Accession #:    [id number]       Weight:       173.0 lb Date of Birth:  [birth year]        BSA:          1.880 m Patient Age:    76 years         BP:           110/70 mmHg Patient Gender: M                HR:           95 bpm. Exam Location:  [place]  Procedure: 2D Echo, Cardiac Doppler and Color Doppler  Indications:    Dyspnea on exertion [R06.09 (  ICD-10-CM)]  History:        Patient has prior history of Echocardiogram examinations, most recent 10/25/2021. Arrythmias:Atrial Fibrillation; Risk Factors:Dyslipidemia and Hypertension.  Sonographer:    Margreta Journey RDCS Referring Phys: [id number] Georgeanna Lea   Sonographer Comments: Global longitudinal strain was attempted. IMPRESSIONS   1. Left ventricular ejection fraction, by estimation, is 60 to 65%. The left ventricle has normal function. The left ventricle has no regional wall motion abnormalities. There is mild left ventricular hypertrophy. Left ventricular diastolic parameters are indeterminate. 2. Right ventricular systolic function is normal. The right ventricular size is normal. There is normal pulmonary artery systolic pressure. 3. Right atrial size was moderately dilated. 4. The mitral valve is normal in structure. Trivial mitral valve regurgitation. No evidence of mitral stenosis. 5. The aortic valve is normal in structure. Aortic  valve regurgitation is not visualized. Aortic valve sclerosis/calcification is present, without any evidence of aortic stenosis. 6. The inferior vena cava is normal in size with greater than 50% respiratory variability, suggesting right atrial pressure of 3 mmHg.  FINDINGS Left Ventricle: Left ventricular ejection fraction, by estimation, is 60 to 65%. The left ventricle has normal function. The left ventricle has no regional wall motion abnormalities. The left ventricular internal cavity size was normal in size. There is mild left ventricular hypertrophy. Left ventricular diastolic parameters are indeterminate.  Right Ventricle: The right ventricular size is normal. No increase in right ventricular wall thickness. Right ventricular systolic function is normal. There is normal pulmonary artery systolic pressure. The tricuspid regurgitant velocity is 1.88 m/s, and with an assumed right atrial pressure of 3 mmHg, the estimated right ventricular systolic pressure is 17.1 mmHg.  Left Atrium: Left atrial size was normal in size.  Right Atrium: Right atrial size was moderately dilated.  Pericardium: There is no evidence of pericardial effusion.  Mitral Valve: The mitral valve is normal in structure. Trivial mitral valve regurgitation. No evidence of mitral valve stenosis.  Tricuspid Valve: The tricuspid valve is normal in structure. Tricuspid valve regurgitation is mild . No evidence of tricuspid stenosis.  Aortic Valve: The aortic valve is normal in structure. Aortic valve regurgitation is not visualized. Aortic valve sclerosis/calcification is present, without any evidence of aortic stenosis.  Pulmonic Valve: The pulmonic valve was normal in structure. Pulmonic valve regurgitation is not visualized. No evidence of pulmonic stenosis.  Aorta: The aortic root is normal in size and structure.  Venous: The inferior vena cava is normal in size with greater than 50% respiratory variability, suggesting  right atrial pressure of 3 mmHg.  IAS/Shunts: No atrial level shunt detected by color flow Doppler.   LEFT VENTRICLE PLAX 2D LVIDd:         3.80 cm   Diastology LVIDs:         2.00 cm   LV e' medial:    20.40 cm/s LV PW:         1.10 cm   LV E/e' medial:  4.5 LV IVS:        1.20 cm   LV e' lateral:   22.90 cm/s LVOT diam:     2.10 cm   LV E/e' lateral: 4.0 LV SV:         71 LV SV Index:   38 LVOT Area:     3.46 cm   RIGHT VENTRICLE             IVC RV Basal diam:  4.70 cm     IVC diam: 1.90  cm RV Mid diam:    3.80 cm RV S prime:     18.20 cm/s TAPSE (M-mode): 2.5 cm  LEFT ATRIUM             Index        RIGHT ATRIUM           Index LA diam:        4.00 cm 2.13 cm/m   RA Area:     28.20 cm LA Vol (A2C):   47.5 ml 25.26 ml/m  RA Volume:   98.30 ml  52.28 ml/m LA Vol (A4C):   58.0 ml 30.85 ml/m LA Biplane Vol: 55.7 ml 29.62 ml/m AORTIC VALVE LVOT Vmax:   91.60 cm/s LVOT Vmean:  70.400 cm/s LVOT VTI:    0.206 m  AORTA Ao Root diam: 3.50 cm Ao Asc diam:  3.20 cm Ao Desc diam: 1.90 cm  MITRAL VALVE               TRICUSPID VALVE MV Area (PHT): 3.60 cm    TR Peak grad:   14.1 mmHg MV Decel Time: 211 msec    TR Vmax:        188.00 cm/s MV E velocity: 91.30 cm/s SHUNTS Systemic VTI:  0.21 m Systemic Diam: 2.10 cm  [name] MD Electronically signed by [name] MD Signature Date/Time: 04/25/2023/12:04:53 PM    Final    MONITORS  LONG TERM MONITOR (3-14 DAYS) 05/06/2023  Narrative Patch Wear Time:  13 days and 23 hours (2024-04-11T13:56:44-0400 to 2024-04-25T13:56:36-0400)  Patient had a min HR of 43 bpm, max HR of 179 bpm, and avg HR of 83 bpm. Predominant underlying rhythm was Sinus Rhythm. Atrial Fibrillation/Flutter occurred (35% burden), ranging from 43-179 bpm (avg of 103 bpm), the longest lasting 18 hours 15 mins with an avg rate of 105 bpm. 2 Pauses occurred, the longest lasting 3.1 secs (19 bpm). Atrial Fibrillation/Flutter was detected  within +/- 45 seconds of symptomatic patient event(s). Isolated SVEs were occasional (2.4%, 40427), SVE Couplets were rare (<1.0%, 355), and no SVE Triplets were present. Isolated VEs were rare (<1.0%), and no VE Couplets or VE Triplets were present.  Summary and conclusions: Multiple episode of atrial flutter/fibrillation total burden 35% average heart rate of 103 longest duration 18 hours 15 minutes. 2 pauses occurred longest 3.1 seconds. Atrial fibrillation/flutter with symptomatic     CARDIAC MRI  MR CARDIAC MORPHOLOGY W WO CONTRAST 02/26/2022  Narrative CLINICAL DATA:  Possible left atrial mass on echo  EXAM: CARDIAC MRI  TECHNIQUE: The patient was scanned on a 1.5 Tesla Siemens magnet. A dedicated cardiac coil was used. Functional imaging was done using Fiesta sequences. 2,3, and 4 chamber views were done to assess for RWMA's. Modified Simpson's rule using a short axis stack was used to calculate an ejection fraction on a dedicated work [employer]. The patient received 10 cc of Gadavist. After 10 minutes inversion recovery sequences were used to assess for infiltration and scar tissue.  CONTRAST:  10 cc  of Gadavist  FINDINGS: Left ventricle:  -Normal wall thickness  -Normal size  -Normal systolic function  -Normal ECV (27%)  -No LGE  LV EF: 64% (Normal 56-78%)  Absolute volumes:  LV EDV: 93mL (Normal 77-195 mL)  LV ESV: 33mL (Normal 19-72 mL)  LV SV: 60mL (Normal 51-133 mL)  CO: 4.3L/min (Normal 2.8-8.8 L/min)  Indexed volumes:  LV EDV: 75mL/sq-m (Normal 47-92 mL/sq-m)  LV ESV: 82mL/sq-m (Normal 13-30 mL/sq-m)  LV SV: 1mL/sq-m (Normal 32-62 mL/sq-m)  CI: 2.2L/min/sq-m (Normal 1.7-4.2 L/min/sq-m)  Right ventricle: Normal size and systolic function  RV EF:  58% (Normal 47-74%)  Absolute volumes:  RV EDV: (Normal 88-227 mL)  RV ESV: 44mL (Normal 23-103 mL)  RV SV: 62mL (Normal 52-138 mL)  CO: 4.4L/min (Normal  2.8-8.8 L/min)  Indexed volumes:  RV EDV: 61mL/sq-m (Normal 55-105 mL/sq-m)  RV ESV: 16mL/sq-m (Normal 15-43 mL/sq-m)  RV SV: 46mL/sq-m (Normal 32-64 mL/sq-m)  CI: 2.3L/min/sq-m (Normal 1.7-4.2 L/min/sq-m)  Left atrium: Mild enlargement.  No mass seen.  Right atrium: Mild enlargement  Mitral valve: Trivial regurgitation  Aortic valve: No regurgitation  Tricuspid valve: Trivial regurgitation  Pulmonic valve: No regurgitation  Aorta: Normal proximal ascending aorta  Pericardium: Normal  IMPRESSION: 1.  No left atrial mass seen  2.  Normal LV size, wall thickness, and systolic function (EF 64%)  3.  Normal RV size and systolic function (EF 58%)  4.  No late gadolinium enhancement to suggest myocardial scar   Electronically Signed By: Epifanio Lesches M.D. On: 02/26/2022 22:02   ______________________________________________________________________________________________      Risk Assessment/Calculations:    CHA2DS2-VASc Score = 4   This indicates a 4.8% annual risk of stroke. The patient's score is based upon: CHF History: 0 HTN History: 1 Diabetes History: 0 Stroke History: 0 Vascular Disease History: 1 Age Score: 2 Gender Score: 0            Physical Exam:   VS:  BP 110/64   Pulse 81   Ht 5\' 6"  (1.676 m)   Wt 167 lb (75.8 kg)   SpO2 95%   BMI 26.95 kg/m    Wt Readings from Last 3 Encounters:  04/01/24 167 lb (75.8 kg)  03/15/24 168 lb (76.2 kg)  03/11/24 167 lb (75.8 kg)    GEN: Well nourished, well developed in no acute distress NECK: No JVD; No carotid bruits CARDIAC: RRR, no murmurs, rubs, gallops RESPIRATORY:  Clear to auscultation without rales, wheezing or rhonchi  ABDOMEN: Soft, non-tender, non-distended EXTREMITIES:  No edema; No deformity   ASSESSMENT AND PLAN: .   Coronary artery disease-he had an abnormal calcium score in preparation for his ablation >> arranged for left heart cath which revealed two-vessel significant  disease CTO of the proximal to mid circumflex, diffuse disease to LAD but not hemodynamically significant. He is asymptomatic.  Currently on Eliquis, Lipitor, nitroglycerin, continue metoprolol 25 mg daily.  Will arrange for her direct LDL, LP(a) to see if we need to get better control of his cholesterol.  Hypertension-blood pressure is well-controlled at 120/70, continue metoprolol 25 mg daily.  Dyslipidemia- Currently on Lipitor 40 mg daily, will check LFTs, direct LDL, LP(a), is formally managed by his PCP so do not have access to his most recent levels.  Paroxysmal atrial fibrillation/hypercoagulable state-CHA2DS2-VASc score of 4, he is maintaining sinus rhythm, previously on flecainide but this has been discontinued in light of his heart disease.  Continue Eliquis 5 mg twice daily--no indication for dose reduction, will repeat CBC BMET today.  Continue metoprolol 25 mg daily.  Previously was being evaluated for atrial fibrillation ablation however this has been put on hold so we can further evaluate his coronary artery disease, he is not sure if he wants to proceed further with ablation at this time.        Dispo: Labs per above, keep follow-up with Dr. Bing Matter.   Signed, Flossie Dibble, NP

## 2024-04-01 ENCOUNTER — Encounter: Payer: Self-pay | Admitting: Cardiology

## 2024-04-01 ENCOUNTER — Ambulatory Visit: Attending: Cardiology | Admitting: Cardiology

## 2024-04-01 VITALS — BP 110/64 | HR 81 | Ht 66.0 in | Wt 167.0 lb

## 2024-04-01 DIAGNOSIS — I10 Essential (primary) hypertension: Secondary | ICD-10-CM | POA: Diagnosis not present

## 2024-04-01 DIAGNOSIS — E782 Mixed hyperlipidemia: Secondary | ICD-10-CM

## 2024-04-01 DIAGNOSIS — I2510 Atherosclerotic heart disease of native coronary artery without angina pectoris: Secondary | ICD-10-CM | POA: Diagnosis not present

## 2024-04-01 DIAGNOSIS — I4891 Unspecified atrial fibrillation: Secondary | ICD-10-CM

## 2024-04-01 DIAGNOSIS — D6859 Other primary thrombophilia: Secondary | ICD-10-CM | POA: Diagnosis not present

## 2024-04-01 NOTE — Patient Instructions (Signed)
 Medication Instructions:  Your physician recommends that you continue on your current medications as directed. Please refer to the Current Medication list given to you today.  *If you need a refill on your cardiac medications before your next appointment, please call your pharmacy*   Lab Work: Your physician recommends that you have labs done in the office today. Your test included  basic metabolic panel, complete blood count, and direct LDL   If you have labs (blood work) drawn today and your tests are completely normal, you will receive your results only by: MyChart Message (if you have MyChart) OR A paper copy in the mail If you have any lab test that is abnormal or we need to change your treatment, we will call you to review the results.   Testing/Procedures: None Ordered   Follow-Up: At [facility], you and your health needs are our priority.  As part of our continuing mission to provide you with exceptional heart care, we have created designated Provider Care Teams.  These Care Teams include your primary Cardiologist (physician) and Advanced Practice Providers (APPs -  Physician Assistants and Nurse Practitioners) who all work together to provide you with the care you need, when you need it.  We recommend signing up for the patient portal called "MyChart".  Sign up information is provided on this After Visit Summary.  MyChart is used to connect with patients for Virtual Visits (Telemedicine).  Patients are able to view lab/test results, encounter notes, upcoming appointments, etc.  Non-urgent messages can be sent to your provider as well.   To learn more about what you can do with MyChart, go to [email or web address].    Your next appointment:   Keep follow up in may

## 2024-04-03 ENCOUNTER — Encounter: Payer: Self-pay | Admitting: Cardiology

## 2024-04-03 LAB — COMPREHENSIVE METABOLIC PANEL WITH GFR
ALT: 30 IU/L (ref 0–44)
AST: 29 IU/L (ref 0–40)
Albumin: 4.3 g/dL (ref 3.8–4.8)
Alkaline Phosphatase: 85 IU/L (ref 44–121)
BUN/Creatinine Ratio: 24 (ref 10–24)
BUN: 17 mg/dL (ref 8–27)
Bilirubin Total: 0.4 mg/dL (ref 0.0–1.2)
CO2: 26 mmol/L (ref 20–29)
Calcium: 9.2 mg/dL (ref 8.6–10.2)
Chloride: 97 mmol/L (ref 96–106)
Creatinine, Ser: 0.7 mg/dL — ABNORMAL LOW (ref 0.76–1.27)
Globulin, Total: 2.6 g/dL (ref 1.5–4.5)
Glucose: 110 mg/dL — ABNORMAL HIGH (ref 70–99)
Potassium: 3.8 mmol/L (ref 3.5–5.2)
Sodium: 137 mmol/L (ref 134–144)
Total Protein: 6.9 g/dL (ref 6.0–8.5)
eGFR: 96 mL/min/1.73

## 2024-04-03 LAB — CBC
Hematocrit: 37.2 % — ABNORMAL LOW (ref 37.5–51.0)
Hemoglobin: 12.2 g/dL — ABNORMAL LOW (ref 13.0–17.7)
MCH: 33.1 pg — ABNORMAL HIGH (ref 26.6–33.0)
MCHC: 32.8 g/dL (ref 31.5–35.7)
MCV: 101 fL — ABNORMAL HIGH (ref 79–97)
Platelets: 201 x10E3/uL (ref 150–450)
RBC: 3.69 x10E6/uL — ABNORMAL LOW (ref 4.14–5.80)
RDW: 12.7 % (ref 11.6–15.4)
WBC: 5.4 x10E3/uL (ref 3.4–10.8)

## 2024-04-03 LAB — LIPOPROTEIN A (LPA): Lipoprotein (a): 210.9 nmol/L — ABNORMAL HIGH

## 2024-04-03 LAB — LDL CHOLESTEROL, DIRECT: LDL Direct: 78 mg/dL (ref 0–99)

## 2024-04-05 ENCOUNTER — Telehealth: Payer: Self-pay

## 2024-04-05 DIAGNOSIS — E782 Mixed hyperlipidemia: Secondary | ICD-10-CM

## 2024-04-05 DIAGNOSIS — E785 Hyperlipidemia, unspecified: Secondary | ICD-10-CM

## 2024-04-05 MED ORDER — ATORVASTATIN CALCIUM 80 MG PO TABS: 80.0000 mg | ORAL_TABLET | Freq: Every day | ORAL | 3 refills | Status: AC

## 2024-04-05 NOTE — Telephone Encounter (Signed)
 Patient notified of results and recommendations and agreed with plan, medication sent, lab order on file.

## 2024-04-05 NOTE — Telephone Encounter (Signed)
-----   Message from Nurse Glenetta Hew sent at 04/05/2024 11:33 AM EDT -----  ----- Message ----- From: Flossie Dibble, NP Sent: 04/03/2024   8:43 PM EDT To: Lonia Farber, RN  Slight anemia.   Cholesterol is too high, and he has elevated LPA, so we really have to get his cholesterol under excellent control.  Increase Lipitor to 80 mg daily.   Return in 6 weeks for FLP and LFTs.

## 2024-04-19 ENCOUNTER — Ambulatory Visit (HOSPITAL_COMMUNITY): Admitting: Internal Medicine

## 2024-04-20 DIAGNOSIS — G4733 Obstructive sleep apnea (adult) (pediatric): Secondary | ICD-10-CM | POA: Diagnosis not present

## 2024-04-27 ENCOUNTER — Telehealth: Payer: Self-pay

## 2024-04-27 NOTE — Telephone Encounter (Signed)
   Pre-operative Risk Assessment    Patient Name: Maxwell Hopkins  DOB: March 09, 1946 MRN: [id number]   Date of last office visit: 04/01/2024 Date of next office visit: 05/28/2024 { Request for Surgical Clearance    Procedure:  dentral extraction x2  Date of Surgery:  Clearance TBD                                Surgeon:  Not specified Surgeon's Group or Practice Name:  [facility] Phone number:  : [phone] Fax number:  : [phone]   Type of Clearance Requested:   - Pharmacy:  Hold Apixaban  (Eliquis ) Not specified   Type of Anesthesia:  Not Indicated Additional requests/questions:  Please fax a copy of [phone] to the surgeon's office.  Signed, Brinley Treanor M Roy Snuffer   04/27/2024, 9:21 AM

## 2024-04-27 NOTE — Telephone Encounter (Signed)
    Primary Cardiologist: Ralene Burger, MD  Chart reviewed as part of pre-operative protocol coverage. Simple dental extractions are considered low risk procedures per guidelines and generally do not require any specific cardiac clearance. It is also generally accepted that for simple extractions and dental cleanings, there is no need to interrupt blood thinner therapy.   SBE prophylaxis is not required for the patient.  I will route this recommendation to the requesting party via Epic fax function and remove from pre-op pool.  Please call with questions.  Carie Charity, NP 04/27/2024, 9:50 AM

## 2024-04-29 DIAGNOSIS — I10 Essential (primary) hypertension: Secondary | ICD-10-CM | POA: Diagnosis not present

## 2024-04-29 DIAGNOSIS — E785 Hyperlipidemia, unspecified: Secondary | ICD-10-CM | POA: Diagnosis not present

## 2024-05-18 DIAGNOSIS — E782 Mixed hyperlipidemia: Secondary | ICD-10-CM | POA: Diagnosis not present

## 2024-05-18 DIAGNOSIS — E785 Hyperlipidemia, unspecified: Secondary | ICD-10-CM | POA: Diagnosis not present

## 2024-05-19 ENCOUNTER — Ambulatory Visit: Payer: Self-pay | Admitting: Cardiology

## 2024-05-19 DIAGNOSIS — E782 Mixed hyperlipidemia: Secondary | ICD-10-CM

## 2024-05-19 LAB — LIPID PANEL
Chol/HDL Ratio: 3.6 ratio (ref 0.0–5.0)
Cholesterol, Total: 154 mg/dL (ref 100–199)
HDL: 43 mg/dL
LDL Chol Calc (NIH): 93 mg/dL (ref 0–99)
Triglycerides: 95 mg/dL (ref 0–149)
VLDL Cholesterol Cal: 18 mg/dL (ref 5–40)

## 2024-05-19 LAB — AST: AST: 27 IU/L (ref 0–40)

## 2024-05-19 LAB — ALT: ALT: 31 IU/L (ref 0–44)

## 2024-05-19 MED ORDER — EZETIMIBE 10 MG PO TABS: 10.0000 mg | ORAL_TABLET | Freq: Every day | ORAL | 3 refills | Status: DC

## 2024-05-19 NOTE — Telephone Encounter (Signed)
 Patient notified of results and recommendations and agreed with plan, medication sent, lab order on file.  I also confirmed he is on Lipitor 80mg  and he's taking this in the evenings.

## 2024-05-19 NOTE — Telephone Encounter (Signed)
-----   Message from Terrance Ferretti sent at 05/19/2024  8:19 AM EDT ----- We increased his Lipitor to 80 mg each evening and his bad cholesterol actually got worse. Can we confirm he is taking Lipitor 80  mg each evening?  If he is, add Zetia 10 mg each evening, repeat FLP and LFTs in 6 weeks.

## 2024-05-20 DIAGNOSIS — G4733 Obstructive sleep apnea (adult) (pediatric): Secondary | ICD-10-CM | POA: Diagnosis not present

## 2024-05-27 ENCOUNTER — Other Ambulatory Visit: Payer: Self-pay

## 2024-05-28 ENCOUNTER — Ambulatory Visit: Payer: 59 | Attending: Cardiology | Admitting: Cardiology

## 2024-05-28 ENCOUNTER — Encounter: Payer: Self-pay | Admitting: Cardiology

## 2024-05-28 VITALS — BP 112/74 | HR 82 | Ht 66.0 in | Wt 162.4 lb

## 2024-05-28 DIAGNOSIS — I480 Paroxysmal atrial fibrillation: Secondary | ICD-10-CM

## 2024-05-28 DIAGNOSIS — E782 Mixed hyperlipidemia: Secondary | ICD-10-CM | POA: Diagnosis not present

## 2024-05-28 DIAGNOSIS — I10 Essential (primary) hypertension: Secondary | ICD-10-CM | POA: Diagnosis not present

## 2024-05-28 DIAGNOSIS — I2510 Atherosclerotic heart disease of native coronary artery without angina pectoris: Secondary | ICD-10-CM | POA: Diagnosis not present

## 2024-05-28 MED ORDER — NEXLIZET 180-10 MG PO TABS: 1.0000 | ORAL_TABLET | Freq: Every day | ORAL | 6 refills | Status: DC

## 2024-05-28 NOTE — Progress Notes (Signed)
 Cardiology Office Note:    Date:  05/28/2024   ID:  Maxwell Hopkins, Maxwell Hopkins 15-Feb-1946, MRN [id number]  PCP:  Barbar Levine, MD  Cardiologist:  Ralene Burger, MD    Referring MD: Barbar Levine, MD   No chief complaint on file.   History of Present Illness:    Maxwell Hopkins is a 78 y.o. male  Past medical history significant for paroxysmal atrial fibrillation recurrent continue rhythm control strategy initially with flecainide , he was scheduled to have atrial fibrillation ablation as a part of ablation coronary CT has been done and he was fine to have severe calcification with a completely occluded circumflex artery.  After that cardiac catheterization confirmed presence of completely occluded circumflex artery and multiple stenosis not critical in the LAD.  Medical therapy has been instituted.  Since that time he said he is doing fine denies have any chest pain tightness squeezing pressure burning chest.  He can walk with no difficulties.  Problem is arrhythmia he still gets some palpitations on his own he started taking 50 mg flecainide  daily.  Flecainide  has been discontinued because of high risk scenario with coronary artery disease.  Otherwise he is doing fine  Past Medical History:  Diagnosis Date   Atrial flutter (HCC) 10/05/2021   Atrial tachycardia (HCC)    Borderline diabetes 10/05/2021   CAD (coronary artery disease) 03/10/2024   Calcium  score 1961 on coronary CTA   Dyslipidemia 10/05/2021   ED (erectile dysfunction)    Elevated Lp(a) 2025   Essential hypertension 10/05/2021   Hyperlipidemia    Hypertension    Obstructive sleep apnea 04/10/2023   Palpitations    Paroxysmal atrial fibrillation (HCC) 11/07/2021   PFO (patent foramen ovale) 03/10/2024   Prediabetes     Past Surgical History:  Procedure Laterality Date   CORONARY PRESSURE/FFR WITH 3D MAPPING N/A 03/15/2024   Procedure: Coronary Pressure/FFR w/3D Mapping;  Surgeon: Knox Perl, MD;  Location: [facility]  INVASIVE CV LAB;  Service: Cardiovascular;  Laterality: N/A;   LEFT HEART CATH AND CORONARY ANGIOGRAPHY N/A 03/15/2024   Procedure: LEFT HEART CATH AND CORONARY ANGIOGRAPHY;  Surgeon: Knox Perl, MD;  Location: [facility] INVASIVE CV LAB;  Service: Cardiovascular;  Laterality: N/A;   NO PAST SURGERIES      Current Medications: Current Meds  Medication Sig   apixaban  (ELIQUIS ) 5 MG TABS tablet Take 1 tablet (5 mg total) by mouth 2 (two) times daily.   Ascorbic Acid (VITAMIN C) 1000 MG tablet Take 1,000 mg by mouth daily.   atorvastatin  (LIPITOR) 80 MG tablet Take 1 tablet (80 mg total) by mouth daily.   ezetimibe (ZETIA) 10 MG tablet Take 1 tablet (10 mg total) by mouth daily.   flecainide  (TAMBOCOR ) 50 MG tablet Take 50 mg by mouth daily.   fluticasone (FLONASE) 50 MCG/ACT nasal spray Place 1 spray into both nostrils 2 (two) times daily.   meclizine (ANTIVERT) 25 MG tablet Take 25 mg by mouth every 8 (eight) hours as needed for dizziness.   metoprolol  succinate (TOPROL  XL) 25 MG 24 hr tablet Take 1 tablet (25 mg total) by mouth daily.   Multiple Vitamins-Minerals (EYE VITAMINS) CAPS Take 1 capsule by mouth daily.   Multiple Vitamins-Minerals (MULTIVITAMIN WITH MINERALS) tablet Take 1 tablet by mouth daily. Men 50+   multivitamin-lutein (OCUVITE-LUTEIN) CAPS capsule Take 1 capsule by mouth daily.   nitroGLYCERIN  (NITROSTAT ) 0.4 MG SL tablet Place 1 tablet (0.4 mg total) under the tongue every 5 (five) minutes as needed for  chest pain.   tadalafil (CIALIS) 5 MG tablet Take 5 mg by mouth daily.   tamsulosin (FLOMAX) 0.4 MG CAPS capsule Take 0.4 mg by mouth once.     Allergies:   Penicillin g   Social History   Socioeconomic History   Marital status: Married    Spouse name: Not on file   Number of children: Not on file   Years of education: Not on file   Highest education level: Not on file  Occupational History   Not on file  Tobacco Use   Smoking status: Never   Smokeless tobacco: Never   Substance and Sexual Activity   Alcohol  use: Not Currently   Drug use: Never   Sexual activity: Yes  Other Topics Concern   Not on file  Social History Narrative   Not on file   Social Drivers of Health   Financial Resource Strain: Not on file  Food Insecurity: Not on file  Transportation Needs: Not on file  Physical Activity: Not on file  Stress: Not on file  Social Connections: Not on file     Family History: The patient's Family history is unknown by patient. ROS:   Please see the history of present illness.    All 14 point review of systems negative except as described per history of present illness  EKGs/Labs/Other Studies Reviewed:         Recent Labs: 04/01/2024: BUN 17; Creatinine, Ser 0.70; Hemoglobin 12.2; Platelets 201; Potassium 3.8; Sodium 137 05/18/2024: ALT 31  Recent Lipid Panel    Component Value Date/Time   CHOL 154 05/18/2024 0839   TRIG 95 05/18/2024 0839   HDL 43 05/18/2024 0839   CHOLHDL 3.6 05/18/2024 0839   LDLCALC 93 05/18/2024 0839   LDLDIRECT 78 04/01/2024 0000    Physical Exam:    VS:  BP 112/74   Pulse 82   Ht 5\' 6"  (1.676 m)   Wt 162 lb 6.4 oz (73.7 kg)   SpO2 94%   BMI 26.21 kg/m     Wt Readings from Last 3 Encounters:  05/28/24 162 lb 6.4 oz (73.7 kg)  04/01/24 167 lb (75.8 kg)  03/15/24 168 lb (76.2 kg)     GEN:  Well nourished, well developed in no acute distress HEENT: Normal NECK: No JVD; No carotid bruits LYMPHATICS: No lymphadenopathy CARDIAC: RRR, no murmurs, no rubs, no gallops RESPIRATORY:  Clear to auscultation without rales, wheezing or rhonchi  ABDOMEN: Soft, non-tender, non-distended MUSCULOSKELETAL:  No edema; No deformity  SKIN: Warm and dry LOWER EXTREMITIES: no swelling NEUROLOGIC:  Alert and oriented x 3 PSYCHIATRIC:  Normal affect   ASSESSMENT:    1. Coronary artery disease involving native coronary artery of native heart without angina pectoris   2. Paroxysmal atrial fibrillation (HCC)   3.  Essential hypertension   4. Mixed hyperlipidemia    PLAN:    In order of problems listed above:  Coronary artery disease stable from that point review on appropriate guideline directed medical therapy. Dyslipidemia his cholesterol still not well-controlled, LDL 93 HDL 43 he is already on Lipitor 80 Zetia  will switch Zetia  to Nexlizet . Paroxysmal atrial fibrillation.  Asking to stop flecainide , he will be referred back to our EP team for consideration of ablation again.   Medication Adjustments/Labs and Tests Ordered: Current medicines are reviewed at length with the patient today.  Concerns regarding medicines are outlined above.  Orders Placed This Encounter  Procedures   EKG 12-Lead   Medication changes:  No orders of the defined types were placed in this encounter.   Signed, Manfred Seed, MD, [facility] 05/28/2024 1:46 PM    [facility]

## 2024-05-28 NOTE — Patient Instructions (Signed)
 Medication Instructions:   STOP: Zetia ( Ezetimibe)  START: Nexlizet 180mg /10mg  1 tablet daily  STOP: Flecainide     Lab Work: Your physician recommends that you return for lab work in: 6 weeks You need to have labs done when you are fasting.  You can come Monday through Friday 8:30 am to 12:00 pm and 1:15 to 4:30. You do not need to make an appointment as the order has already been placed. The labs you are going to have done are AST, ALT Lipids.    Testing/Procedures: None Ordered   Follow-Up: At [facility], you and your health needs are our priority.  As part of our continuing mission to provide you with exceptional heart care, we have created designated Provider Care Teams.  These Care Teams include your primary Cardiologist (physician) and Advanced Practice Providers (APPs -  Physician Assistants and Nurse Practitioners) who all work together to provide you with the care you need, when you need it.  We recommend signing up for the patient portal called "MyChart".  Sign up information is provided on this After Visit Summary.  MyChart is used to connect with patients for Virtual Visits (Telemedicine).  Patients are able to view lab/test results, encounter notes, upcoming appointments, etc.  Non-urgent messages can be sent to your provider as well.   To learn more about what you can do with MyChart, go to [email or web address].    Your next appointment:   4 month(s)  The format for your next appointment:   In Person  Provider:   Ralene Burger, MD    Other Instructions NA

## 2024-05-30 DIAGNOSIS — I10 Essential (primary) hypertension: Secondary | ICD-10-CM | POA: Diagnosis not present

## 2024-05-30 DIAGNOSIS — E785 Hyperlipidemia, unspecified: Secondary | ICD-10-CM | POA: Diagnosis not present

## 2024-06-01 ENCOUNTER — Telehealth: Payer: Self-pay | Admitting: Cardiology

## 2024-06-01 NOTE — Telephone Encounter (Signed)
 Patient coming in today because his pharmacy is requiring prior auth for Nexlizet - Patient is requesting per pharmacy we do "prior authorization expedite" which means they will handle it in 72 hours   [phone]

## 2024-06-02 ENCOUNTER — Telehealth: Payer: Self-pay

## 2024-06-02 ENCOUNTER — Other Ambulatory Visit (HOSPITAL_COMMUNITY): Payer: Self-pay

## 2024-06-02 NOTE — Telephone Encounter (Signed)
 Pharmacy Patient Advocate Encounter   Received notification from Physician's Office that prior authorization for NEXLIZET  is required/requested.   Insurance verification completed.   The patient is insured through [facility] [employer] .   Per test claim: PA required; PA submitted to above mentioned insurance via CoverMyMeds Key/confirmation #/EOC [id number] Status is pending

## 2024-06-02 NOTE — Telephone Encounter (Signed)
 Pharmacy Patient Advocate Encounter  Received notification from [employer] that Prior Authorization for NEXLIZET  has been APPROVED from 06/02/24 to 12/02/24 Pharmacy filled already.

## 2024-06-02 NOTE — Telephone Encounter (Signed)
 PA request has been Submitted. New Encounter has been or will be created for follow up. For additional info see Pharmacy Prior Auth telephone encounter from 06/02/24.

## 2024-06-09 DIAGNOSIS — I10 Essential (primary) hypertension: Secondary | ICD-10-CM | POA: Diagnosis not present

## 2024-06-09 DIAGNOSIS — R7303 Prediabetes: Secondary | ICD-10-CM | POA: Diagnosis not present

## 2024-06-09 DIAGNOSIS — E785 Hyperlipidemia, unspecified: Secondary | ICD-10-CM | POA: Diagnosis not present

## 2024-06-10 ENCOUNTER — Other Ambulatory Visit: Payer: Self-pay | Admitting: Cardiology

## 2024-06-10 DIAGNOSIS — I4891 Unspecified atrial fibrillation: Secondary | ICD-10-CM

## 2024-06-10 NOTE — Telephone Encounter (Signed)
 Prescription refill request for Eliquis  received. Indication: a fib Last office visit: 05/28/24 Scr: 0.7 epic 04/01/24 Age: 78 Weight: 73kg

## 2024-06-16 ENCOUNTER — Encounter: Payer: Self-pay | Admitting: Cardiology

## 2024-06-16 ENCOUNTER — Ambulatory Visit: Attending: Cardiology | Admitting: Cardiology

## 2024-06-16 VITALS — BP 116/72 | HR 53 | Ht 66.0 in | Wt 164.0 lb

## 2024-06-16 DIAGNOSIS — G4733 Obstructive sleep apnea (adult) (pediatric): Secondary | ICD-10-CM | POA: Diagnosis not present

## 2024-06-16 DIAGNOSIS — I2510 Atherosclerotic heart disease of native coronary artery without angina pectoris: Secondary | ICD-10-CM | POA: Diagnosis not present

## 2024-06-16 DIAGNOSIS — I480 Paroxysmal atrial fibrillation: Secondary | ICD-10-CM

## 2024-06-16 DIAGNOSIS — Z01812 Encounter for preprocedural laboratory examination: Secondary | ICD-10-CM | POA: Diagnosis not present

## 2024-06-16 DIAGNOSIS — I10 Essential (primary) hypertension: Secondary | ICD-10-CM

## 2024-06-16 DIAGNOSIS — D6869 Other thrombophilia: Secondary | ICD-10-CM

## 2024-06-16 MED ORDER — AMIODARONE HCL 200 MG PO TABS: ORAL_TABLET | ORAL | 3 refills | Status: DC

## 2024-06-16 NOTE — Patient Instructions (Addendum)
 Medication Instructions:  Your physician has recommended you make the following change in your medication:  START Amiodarone  - take 2 tablets (400 mg total) TWICE a day for 2 weeks, then  - take 1 tablet (200 mg total) TWICE a day for 2 weeks, then  - take 1 tablet (200 mg total) ONCE a day   *If you need a refill on your cardiac medications before your next appointment, please call your pharmacy*   Lab Work: Pre procedure labs -- we will call you to schedule:  BMP & CBC  If you have a lab test that is abnormal and we need to change your treatment, we will call you to review the results -- otherwise no news is good news.    Testing/Procedures: Your physician has requested that you have cardiac CT 3 weeks PRIOR to your ablation. Cardiac computed tomography (CT) is a painless test that uses an x-ray machine to take clear, detailed pictures of your heart. We will contact you if the result is abnormal. We will call you to schedule.  Your physician has recommended that you have an ablation. Catheter ablation is a medical procedure used to treat some cardiac arrhythmias (irregular heartbeats). During catheter ablation, a long, thin, flexible tube is put into a blood vessel in your groin (upper thigh), or neck. This tube is called an ablation catheter. It is then guided to your heart through the blood vessel. Radio frequency waves destroy small areas of heart tissue where abnormal heartbeats may cause an arrhythmia to start.   Your ablation is scheduled for 09/21/2024. Please arrive at [facility] at 10:30 am.  We will call/send instructions at a later date.   Follow-Up: At [facility], you and your health needs are our priority.  As part of our continuing mission to provide you with exceptional heart care, we have created designated Provider Care Teams.  These Care Teams include your primary Cardiologist (physician) and Advanced Practice Providers (APPs -  Physician Assistants and  Nurse Practitioners) who all work together to provide you with the care you need, when you need it.  Your next appointment:   1 month(s) after your ablation  The format for your next appointment:   In Person  Provider:   AFib clinic   Thank you for choosing [facility]!!   Reece Cane, RN [phone]    Other Instructions   Cardiac Ablation Cardiac ablation is a procedure to destroy (ablate) some heart tissue that is sending bad signals. These bad signals cause problems in heart rhythm. The heart has many areas that make these signals. If there are problems in these areas, they can make the heart beat in a way that is not normal. Destroying some tissues can help make the heart rhythm normal. Tell your doctor about: Any allergies you have. All medicines you are taking. These include vitamins, herbs, eye drops, creams, and over-the-counter medicines. Any problems you or family members have had with medicines that make you fall asleep (anesthetics). Any blood disorders you have. Any surgeries you have had. Any medical conditions you have, such as kidney failure. Whether you are pregnant or may be pregnant. What are the risks? This is a safe procedure. But problems may occur, including: Infection. Bruising and bleeding. Bleeding into the chest. Stroke or blood clots. Damage to nearby areas of your body. Allergies to medicines or dyes. The need for a pacemaker if the normal system is damaged. Failure of the procedure to treat the  problem. What happens before the procedure? Medicines Ask your doctor about: Changing or stopping your normal medicines. This is important. Taking aspirin  and ibuprofen. Do not take these medicines unless your doctor tells you to take them. Taking other medicines, vitamins, herbs, and supplements. General instructions Follow instructions from your doctor about what you cannot eat or drink. Plan to have someone take you home from the  hospital or clinic. If you will be going home right after the procedure, plan to have someone with you for 24 hours. Ask your doctor what steps will be taken to prevent infection. What happens during the procedure?  An IV tube will be put into one of your veins. You will be given a medicine to help you relax. The skin on your neck or groin will be numbed. A cut (incision) will be made in your neck or groin. A needle will be put through your cut and into a large vein. A tube (catheter) will be put into the needle. The tube will be moved to your heart. Dye may be put through the tube. This helps your doctor see your heart. Small devices (electrodes) on the tube will send out signals. A type of energy will be used to destroy some heart tissue. The tube will be taken out. Pressure will be held on your cut. This helps stop bleeding. A bandage will be put over your cut. The exact procedure may vary among doctors and hospitals. What happens after the procedure? You will be watched until you leave the hospital or clinic. This includes checking your heart rate, breathing rate, oxygen, and blood pressure. Your cut will be watched for bleeding. You will need to lie still for a few hours. Do not drive for 24 hours or as long as your doctor tells you. Summary Cardiac ablation is a procedure to destroy some heart tissue. This is done to treat heart rhythm problems. Tell your doctor about any medical conditions you may have. Tell him or her about all medicines you are taking to treat them. This is a safe procedure. But problems may occur. These include infection, bruising, bleeding, and damage to nearby areas of your body. Follow what your doctor tells you about food and drink. You may also be told to change or stop some of your medicines. After the procedure, do not drive for 24 hours or as long as your doctor tells you. This information is not intended to replace advice given to you by your health care  provider. Make sure you discuss any questions you have with your health care provider. Document Revised: 03/08/2022 Document Reviewed: 11/18/2019 Elsevier Patient Education  2023 Elsevier Inc.   Cardiac Ablation, Care After  This sheet gives you information about how to care for yourself after your procedure. Your health care provider may also give you more specific instructions. If you have problems or questions, contact your health care provider. What can I expect after the procedure? After the procedure, it is common to have: Bruising around your puncture site. Tenderness around your puncture site. Skipped heartbeats. If you had an atrial fibrillation ablation, you may have atrial fibrillation during the first several months after your procedure.  Tiredness (fatigue).  Follow these instructions at home: Puncture site care  Follow instructions from your health care provider about how to take care of your puncture site. Make sure you: If present, leave stitches (sutures), skin glue, or adhesive strips in place. These skin closures may need to stay in place for up  to 2 weeks. If adhesive strip edges start to loosen and curl up, you may trim the loose edges. Do not remove adhesive strips completely unless your health care provider tells you to do that. If a large square bandage is present, this may be removed 24 hours after surgery.  Check your puncture site every day for signs of infection. Check for: Redness, swelling, or pain. Fluid or blood. If your puncture site starts to bleed, lie down on your back, apply firm pressure to the area, and contact your health care provider. Warmth. Pus or a bad smell. A pea or small marble sized lump at the site is normal and can take up to three months to resolve.  Driving Do not drive for at least 4 days after your procedure or however long your health care provider recommends. (Do not resume driving if you have previously been instructed not to drive  for other health reasons.) Do not drive or use heavy machinery while taking prescription pain medicine. Activity Avoid activities that take a lot of effort for at least 7 days after your procedure. Do not lift anything that is heavier than 5 lb (4.5 kg) for one week.  No sexual activity for 1 week.  Return to your normal activities as told by your health care provider. Ask your health care provider what activities are safe for you. General instructions Take over-the-counter and prescription medicines only as told by your health care provider. Do not use any products that contain nicotine or tobacco, such as cigarettes and e-cigarettes. If you need help quitting, ask your health care provider. You may shower after 24 hours, but Do not take baths, swim, or use a hot tub for 1 week.  Do not drink alcohol  for 24 hours after your procedure. Keep all follow-up visits as told by your health care provider. This is important. Contact a health care provider if: You have redness, mild swelling, or pain around your puncture site. You have fluid or blood coming from your puncture site that stops after applying firm pressure to the area. Your puncture site feels warm to the touch. You have pus or a bad smell coming from your puncture site. You have a fever. You have chest pain or discomfort that spreads to your neck, jaw, or arm. You have chest pain that is worse with lying on your back or taking a deep breath. You are sweating a lot. You feel nauseous. You have a fast or irregular heartbeat. You have shortness of breath. You are dizzy or light-headed and feel the need to lie down. You have pain or numbness in the arm or leg closest to your puncture site. Get help right away if: Your puncture site suddenly swells. Your puncture site is bleeding and the bleeding does not stop after applying firm pressure to the area. These symptoms may represent a serious problem that is an emergency. Do not wait to  see if the symptoms will go away. Get medical help right away. Call your local emergency services (911 in the [place] Do not drive yourself to the hospital. Summary After the procedure, it is normal to have bruising and tenderness at the puncture site in your groin, neck, or forearm. Check your puncture site every day for signs of infection. Get help right away if your puncture site is bleeding and the bleeding does not stop after applying firm pressure to the area. This is a medical emergency. This information is not intended to replace advice given to  you by your health care provider. Make sure you discuss any questions you have with your health care provider.

## 2024-06-16 NOTE — Progress Notes (Signed)
  Electrophysiology Office Note:   Date:  06/16/2024  ID:  Maxwell Hopkins, Maxwell Hopkins 1946-10-26, MRN [id number]  Primary Cardiologist: Ralene Burger, MD Primary Heart Failure: None Electrophysiologist: Ziara Thelander Cortland Ding, MD      History of Present Illness:   Maxwell Hopkins is a 78 y.o. male with h/o, hyperlipidemia, diabetes, atrial fibrillation/flutter, sleep apnea seen today for routine electrophysiology followup.   Since last being seen in our clinic the patient reports doing overall well.  He has no chest pain and no shortness of breath.  Despite this, he continues to have atrial fibrillation that makes him feel weak, fatigued, short of breath.  He has episodes 1-2 times a week.  He has been taken off his flecainide  due to the presence of coronary artery disease.  He would like to avoid long-term antiarrhythmics..  he denies chest pain, palpitations, dyspnea, PND, orthopnea, nausea, vomiting, dizziness, syncope, edema, weight gain, or early satiety.   Review of systems complete and found to be negative unless listed in HPI.   EP Information / Studies Reviewed:    EKG is not ordered today. EKG from 05/28/24 reviewed which showed sinus rhythm        Risk Assessment/Calculations:    CHA2DS2-VASc Score = 4   This indicates a 4.8% annual risk of stroke. The patient's score is based upon: CHF History: 0 HTN History: 1 Diabetes History: 0 Stroke History: 0 Vascular Disease History: 1 Age Score: 2 Gender Score: 0            Physical Exam:   VS:  There were no vitals taken for this visit.   Wt Readings from Last 3 Encounters:  05/28/24 162 lb 6.4 oz (73.7 kg)  04/01/24 167 lb (75.8 kg)  03/15/24 168 lb (76.2 kg)     GEN: Well nourished, well developed in no acute distress NECK: No JVD; No carotid bruits CARDIAC: Regular rate and rhythm, no murmurs, rubs, gallops RESPIRATORY:  Clear to auscultation without rales, wheezing or rhonchi  ABDOMEN: Soft, non-tender,  non-distended EXTREMITIES:  No edema; No deformity   ASSESSMENT AND PLAN:    1.  Paroxysmal atrial fibrillation/flutter: Currently on flecainide .  Had prior plans for ablation, but patient canceled as he was found to have severe coronary artery disease.  Left heart catheterization with occluded circumflex.  He has remained stable.  He has had no coronary symptoms.  He continues to have atrial fibrillation.  He prefer ablation.  Finnlee Guarnieri load with amiodarone prior to ablation.  Risk, benefits, and alternatives to EP study and radiofrequency/pulse field ablation for afib were also discussed in detail today. These risks include but are not limited to stroke, bleeding, vascular damage, tamponade, perforation, damage to the esophagus, lungs, and other structures, pulmonary vein stenosis, worsening renal function, and death. The patient understands these risk and wishes to proceed.  We Arnecia Ector therefore proceed with catheter ablation at the next available time.  Carto, ICE, anesthesia are requested for the procedure.  Basim Bartnik also obtain CT PV protocol prior to the procedure to exclude LAA thrombus and further evaluate atrial anatomy.  2.  Hypertension: Well-controlled  3.  Obstructive sleep apnea: CPAP compliance encouraged  4.  Secondary hypercoagulable state: On Eliquis  for atrial fibrillation  5.  Coronary artery disease: Occluded circumflex with mild disease in the LAD.  Continue with current management per primary cardiology.  Follow up with Afib Clinic as usual post procedure  Signed, Brockton Mckesson Cortland Ding, MD

## 2024-06-17 DIAGNOSIS — E785 Hyperlipidemia, unspecified: Secondary | ICD-10-CM | POA: Diagnosis not present

## 2024-06-17 DIAGNOSIS — N62 Hypertrophy of breast: Secondary | ICD-10-CM | POA: Diagnosis not present

## 2024-06-17 DIAGNOSIS — I480 Paroxysmal atrial fibrillation: Secondary | ICD-10-CM | POA: Diagnosis not present

## 2024-06-17 DIAGNOSIS — I10 Essential (primary) hypertension: Secondary | ICD-10-CM | POA: Diagnosis not present

## 2024-06-17 DIAGNOSIS — R7303 Prediabetes: Secondary | ICD-10-CM | POA: Diagnosis not present

## 2024-06-20 DIAGNOSIS — G4733 Obstructive sleep apnea (adult) (pediatric): Secondary | ICD-10-CM | POA: Diagnosis not present

## 2024-06-21 DIAGNOSIS — N62 Hypertrophy of breast: Secondary | ICD-10-CM | POA: Diagnosis not present

## 2024-06-29 DIAGNOSIS — I10 Essential (primary) hypertension: Secondary | ICD-10-CM | POA: Diagnosis not present

## 2024-06-29 DIAGNOSIS — E785 Hyperlipidemia, unspecified: Secondary | ICD-10-CM | POA: Diagnosis not present

## 2024-07-14 DIAGNOSIS — E782 Mixed hyperlipidemia: Secondary | ICD-10-CM | POA: Diagnosis not present

## 2024-07-15 LAB — LIPID PANEL
Chol/HDL Ratio: 2.3 ratio (ref 0.0–5.0)
Cholesterol, Total: 107 mg/dL (ref 100–199)
HDL: 46 mg/dL (ref >39)
LDL Chol Calc (NIH): 50 mg/dL (ref 0–99)
Triglycerides: 42 mg/dL (ref 0–149)
VLDL Cholesterol Cal: 11 mg/dL (ref 5–40)

## 2024-07-15 LAB — AST: AST: 28 IU/L (ref 0–40)

## 2024-07-15 LAB — ALT: ALT: 28 IU/L (ref 0–44)

## 2024-07-16 ENCOUNTER — Ambulatory Visit: Payer: Self-pay | Admitting: Cardiology

## 2024-07-20 DIAGNOSIS — G4733 Obstructive sleep apnea (adult) (pediatric): Secondary | ICD-10-CM | POA: Diagnosis not present

## 2024-07-28 DIAGNOSIS — N62 Hypertrophy of breast: Secondary | ICD-10-CM | POA: Diagnosis not present

## 2024-07-30 DIAGNOSIS — R7303 Prediabetes: Secondary | ICD-10-CM | POA: Diagnosis not present

## 2024-07-30 DIAGNOSIS — I10 Essential (primary) hypertension: Secondary | ICD-10-CM | POA: Diagnosis not present

## 2024-08-10 DIAGNOSIS — H00014 Hordeolum externum left upper eyelid: Secondary | ICD-10-CM | POA: Diagnosis not present

## 2024-08-22 ENCOUNTER — Other Ambulatory Visit: Payer: Self-pay | Admitting: Cardiology

## 2024-08-27 ENCOUNTER — Telehealth: Payer: Self-pay

## 2024-08-27 NOTE — Telephone Encounter (Signed)
 Called pt using Interpretation Services - I went over detailed instructions for CT/Ablation procedure.   He is aware of where to go and what time he needs to be there.   CT is scheduled on 9/2 at 12:00 - After his CT he will have labs done and come to 5th floor to pick up his Ablation Instruction letter. He confirmed that he does have someone that can translate it for him.   He will review and call us  if he has any questions.

## 2024-08-30 DIAGNOSIS — E785 Hyperlipidemia, unspecified: Secondary | ICD-10-CM | POA: Diagnosis not present

## 2024-08-31 ENCOUNTER — Ambulatory Visit (HOSPITAL_COMMUNITY)
Admission: RE | Admit: 2024-08-31 | Discharge: 2024-08-31 | Disposition: A | Discharge status: Home / Self Care | Source: Ambulatory Visit | Attending: Cardiology | Admitting: Cardiology

## 2024-08-31 DIAGNOSIS — Q2112 Patent foramen ovale: Secondary | ICD-10-CM | POA: Insufficient documentation

## 2024-08-31 DIAGNOSIS — Z01812 Encounter for preprocedural laboratory examination: Secondary | ICD-10-CM | POA: Diagnosis not present

## 2024-08-31 DIAGNOSIS — I480 Paroxysmal atrial fibrillation: Secondary | ICD-10-CM | POA: Diagnosis not present

## 2024-08-31 MED ORDER — IOHEXOL 350 MG/ML SOLN
100.0000 mL | Freq: Once | INTRAVENOUS | Status: AC | PRN
  Administered 2024-08-31: 100 mL via INTRAVENOUS

## 2024-09-01 LAB — BASIC METABOLIC PANEL WITH GFR
BUN/Creatinine Ratio: 21 (ref 10–24)
BUN: 20 mg/dL (ref 8–27)
CO2: 22 mmol/L (ref 20–29)
Calcium: 9.1 mg/dL (ref 8.6–10.2)
Chloride: 97 mmol/L (ref 96–106)
Creatinine, Ser: 0.97 mg/dL (ref 0.76–1.27)
Glucose: 87 mg/dL (ref 70–99)
Potassium: 4.4 mmol/L (ref 3.5–5.2)
Sodium: 135 mmol/L (ref 134–144)
eGFR: 81 mL/min/1.73 (ref >59)

## 2024-09-01 LAB — CBC
Hematocrit: 36.7 % — ABNORMAL LOW (ref 37.5–51.0)
Hemoglobin: 11.8 g/dL — ABNORMAL LOW (ref 13.0–17.7)
MCH: 33.2 pg — ABNORMAL HIGH (ref 26.6–33.0)
MCHC: 32.2 g/dL (ref 31.5–35.7)
MCV: 103 fL — ABNORMAL HIGH (ref 79–97)
Platelets: 186 x10E3/uL (ref 150–450)
RBC: 3.55 x10E6/uL — ABNORMAL LOW (ref 4.14–5.80)
RDW: 13.2 % (ref 11.6–15.4)
WBC: 6 x10E3/uL (ref 3.4–10.8)

## 2024-09-02 ENCOUNTER — Ambulatory Visit: Payer: Self-pay | Admitting: Cardiology

## 2024-09-11 ENCOUNTER — Other Ambulatory Visit: Payer: Self-pay | Admitting: Cardiology

## 2024-09-13 DIAGNOSIS — M79652 Pain in left thigh: Secondary | ICD-10-CM | POA: Diagnosis not present

## 2024-09-13 DIAGNOSIS — M545 Low back pain, unspecified: Secondary | ICD-10-CM | POA: Diagnosis not present

## 2024-09-13 DIAGNOSIS — S0990XA Unspecified injury of head, initial encounter: Secondary | ICD-10-CM | POA: Diagnosis not present

## 2024-09-13 DIAGNOSIS — S161XXA Strain of muscle, fascia and tendon at neck level, initial encounter: Secondary | ICD-10-CM | POA: Diagnosis not present

## 2024-09-13 DIAGNOSIS — M5030 Other cervical disc degeneration, unspecified cervical region: Secondary | ICD-10-CM | POA: Diagnosis not present

## 2024-09-13 DIAGNOSIS — S199XXA Unspecified injury of neck, initial encounter: Secondary | ICD-10-CM | POA: Diagnosis not present

## 2024-09-20 NOTE — Anesthesia Preprocedure Evaluation (Signed)
 Anesthesia Evaluation  Patient identified by MRN, date of birth, ID band Patient awake    Reviewed: Allergy & Precautions, NPO status , Patient's Chart, lab work & pertinent test results  Airway Mallampati: II  TM Distance: >3 FB Neck ROM: Full    Dental no notable dental hx.    Pulmonary sleep apnea    Pulmonary exam normal        Cardiovascular hypertension, + CAD  + dysrhythmias Atrial Fibrillation  Rhythm:Irregular Rate:Normal   TTE (2024):  1. Left ventricular ejection fraction, by estimation, is 60 to 65%. The  left ventricle has normal function. The left ventricle has no regional  wall motion abnormalities. There is mild left ventricular hypertrophy.  Left ventricular diastolic parameters  are indeterminate.   2. Right ventricular systolic function is normal. The right ventricular  size is normal. There is normal pulmonary artery systolic pressure.   3. Right atrial size was moderately dilated.   4. The mitral valve is normal in structure. Trivial mitral valve  regurgitation. No evidence of mitral stenosis.   5. The aortic valve is normal in structure. Aortic valve regurgitation is  not visualized. Aortic valve sclerosis/calcification is present, without  any evidence of aortic stenosis.   6. The inferior vena cava is normal in size with greater than 50%  respiratory variability, suggesting     Neuro/Psych negative neurological ROS  negative psych ROS   GI/Hepatic negative GI ROS, Neg liver ROS,,,  Endo/Other  negative endocrine ROS    Renal/GU negative Renal ROS  negative genitourinary   Musculoskeletal negative musculoskeletal ROS (+)    Abdominal Normal abdominal exam  (+)   Peds  Hematology Lab Results      Component                Value               Date                      WBC                      6.0                 08/31/2024                HGB                      11.8 (L)             08/31/2024                HCT                      36.7 (L)            08/31/2024                MCV                      103 (H)             08/31/2024                PLT                      186                 08/31/2024  Lab Results      Component                Value               Date                      NA                       135                 08/31/2024                K                        4.4                 08/31/2024                CO2                      22                  08/31/2024                GLUCOSE                  87                  08/31/2024                BUN                      20                  08/31/2024                CREATININE               0.97                08/31/2024                CALCIUM                   9.1                 08/31/2024                EGFR                     81                  08/31/2024              Anesthesia Other Findings   Reproductive/Obstetrics                              Anesthesia Physical Anesthesia Plan  ASA: 3  Anesthesia Plan: General   Post-op Pain Management:    Induction: Intravenous  PONV Risk Score and Plan: 1 and Ondansetron , Dexamethasone  and Treatment may vary due to age or medical condition  Airway Management Planned: Oral ETT and Mask  Additional Equipment: None  Intra-op Plan:   Post-operative Plan: Extubation in OR  Informed Consent: I have reviewed the patients History and Physical, chart, labs and  discussed the procedure including the risks, benefits and alternatives for the proposed anesthesia with the patient or authorized representative who has indicated his/her understanding and acceptance.     Dental advisory given and Interpreter used for interview  Plan Discussed with: CRNA  Anesthesia Plan Comments: (Anesthetic Hx: None to review  TTE (2024): Normal biventricular function, no valvular abnormalities LHC (2025): Two-vessel  significant disease involving occluded CTO proximal to mid CX which appears fairly complex for PCI but can be attempted.  Distal RCA is focal high-grade stenosis at the bifurcation of PDA and PL branch, amenable for PCI.  Diffusely diseased LAD in the proximal to prox-mid LAD with tandem 60 to 70% stenosis, not hemodynamically significant by virtual FFR.  78 year old male with PMH of HTN, HLD, pre-diabetes, OSA, CAD (medically managed) and atrial fibrillation on Eliquis  - plan for atrial fibrillation ablation. Type and Screen ordered. Labs reviewed - Hgb 11.8, Plts 186, Cr 0.97. Plan for GETA, PIV x 2)         Anesthesia Quick Evaluation

## 2024-09-21 ENCOUNTER — Other Ambulatory Visit: Payer: Self-pay

## 2024-09-21 ENCOUNTER — Ambulatory Visit (HOSPITAL_COMMUNITY)
Admission: RE | Disposition: A | Discharge status: Home / Self Care | Payer: Self-pay | Source: Home / Self Care | Attending: Cardiology

## 2024-09-21 ENCOUNTER — Ambulatory Visit (HOSPITAL_COMMUNITY): Admitting: Certified Registered"

## 2024-09-21 ENCOUNTER — Ambulatory Visit (HOSPITAL_COMMUNITY)
Admission: RE | Admit: 2024-09-21 | Discharge: 2024-09-21 | Disposition: A | Discharge status: Home / Self Care | Attending: Cardiology | Admitting: Cardiology

## 2024-09-21 DIAGNOSIS — I10 Essential (primary) hypertension: Secondary | ICD-10-CM | POA: Insufficient documentation

## 2024-09-21 DIAGNOSIS — E785 Hyperlipidemia, unspecified: Secondary | ICD-10-CM | POA: Insufficient documentation

## 2024-09-21 DIAGNOSIS — I480 Paroxysmal atrial fibrillation: Secondary | ICD-10-CM | POA: Insufficient documentation

## 2024-09-21 DIAGNOSIS — Z79899 Other long term (current) drug therapy: Secondary | ICD-10-CM | POA: Insufficient documentation

## 2024-09-21 DIAGNOSIS — I483 Typical atrial flutter: Secondary | ICD-10-CM | POA: Diagnosis not present

## 2024-09-21 DIAGNOSIS — E119 Type 2 diabetes mellitus without complications: Secondary | ICD-10-CM | POA: Diagnosis not present

## 2024-09-21 DIAGNOSIS — G4730 Sleep apnea, unspecified: Secondary | ICD-10-CM | POA: Insufficient documentation

## 2024-09-21 DIAGNOSIS — I2510 Atherosclerotic heart disease of native coronary artery without angina pectoris: Secondary | ICD-10-CM | POA: Diagnosis not present

## 2024-09-21 DIAGNOSIS — I4891 Unspecified atrial fibrillation: Secondary | ICD-10-CM | POA: Diagnosis not present

## 2024-09-21 HISTORY — PX: ATRIAL FIBRILLATION ABLATION: EP1191

## 2024-09-21 LAB — POCT ACTIVATED CLOTTING TIME: Activated Clotting Time: 273 s

## 2024-09-21 SURGERY — ATRIAL FIBRILLATION ABLATION
Anesthesia: General

## 2024-09-21 MED ORDER — HEPARIN SODIUM (PORCINE) 1000 UNIT/ML IJ SOLN
INTRAMUSCULAR | Status: DC | PRN
Start: 2024-09-21 — End: 2024-09-21
  Administered 2024-09-21: 13000 [IU] via INTRAVENOUS
  Administered 2024-09-21: 4000 [IU] via INTRAVENOUS

## 2024-09-21 MED ORDER — SUGAMMADEX SODIUM 200 MG/2ML IV SOLN
INTRAVENOUS | Status: DC | PRN
  Administered 2024-09-21: 150 mg via INTRAVENOUS

## 2024-09-21 MED ORDER — SODIUM CHLORIDE 0.9% FLUSH: 3.0000 mL | Freq: Two times a day (BID) | INTRAVENOUS | Status: DC

## 2024-09-21 MED ORDER — ACETAMINOPHEN 325 MG PO TABS: 650.0000 mg | ORAL_TABLET | ORAL | Status: DC | PRN

## 2024-09-21 MED ORDER — FENTANYL CITRATE (PF) 250 MCG/5ML IJ SOLN
INTRAMUSCULAR | Status: DC | PRN
  Administered 2024-09-21: 100 ug via INTRAVENOUS

## 2024-09-21 MED ORDER — HEPARIN SODIUM (PORCINE) 1000 UNIT/ML IJ SOLN
INTRAMUSCULAR | Status: AC
  Filled 2024-09-21: qty 20

## 2024-09-21 MED ORDER — PROTAMINE SULFATE 10 MG/ML IV SOLN
INTRAVENOUS | Status: DC | PRN
  Administered 2024-09-21: 40 mg via INTRAVENOUS

## 2024-09-21 MED ORDER — SODIUM CHLORIDE 0.9 % IV SOLN: INTRAVENOUS | Status: DC

## 2024-09-21 MED ORDER — ONDANSETRON HCL 4 MG/2ML IJ SOLN
INTRAMUSCULAR | Status: DC | PRN
Start: 2024-09-21 — End: 2024-09-21
  Administered 2024-09-21: 4 mg via INTRAVENOUS

## 2024-09-21 MED ORDER — PROPOFOL 10 MG/ML IV BOLUS
INTRAVENOUS | Status: DC | PRN
  Administered 2024-09-21: 160 mg via INTRAVENOUS

## 2024-09-21 MED ORDER — ATROPINE SULFATE 1 MG/10ML IJ SOSY
PREFILLED_SYRINGE | INTRAMUSCULAR | Status: DC | PRN
  Administered 2024-09-21: 1 mg via INTRAVENOUS

## 2024-09-21 MED ORDER — ATROPINE SULFATE 1 MG/10ML IJ SOSY
PREFILLED_SYRINGE | INTRAMUSCULAR | Status: AC
  Filled 2024-09-21: qty 10

## 2024-09-21 MED ORDER — SODIUM CHLORIDE 0.9 % IV SOLN: 250.0000 mL | INTRAVENOUS | Status: DC | PRN

## 2024-09-21 MED ORDER — HEPARIN (PORCINE) IN NACL 1000-0.9 UT/500ML-% IV SOLN
INTRAVENOUS | Status: DC | PRN
  Administered 2024-09-21 (×3): 500 mL

## 2024-09-21 MED ORDER — DEXAMETHASONE SODIUM PHOSPHATE 10 MG/ML IJ SOLN
INTRAMUSCULAR | Status: DC | PRN
  Administered 2024-09-21: 10 mg via INTRAVENOUS

## 2024-09-21 MED ORDER — LIDOCAINE 2% (20 MG/ML) 5 ML SYRINGE
INTRAMUSCULAR | Status: DC | PRN
  Administered 2024-09-21: 80 mg via INTRAVENOUS

## 2024-09-21 MED ORDER — ONDANSETRON HCL 4 MG/2ML IJ SOLN: 4.0000 mg | Freq: Four times a day (QID) | INTRAMUSCULAR | Status: DC | PRN

## 2024-09-21 MED ORDER — FENTANYL CITRATE (PF) 100 MCG/2ML IJ SOLN
INTRAMUSCULAR | Status: AC
  Filled 2024-09-21: qty 2

## 2024-09-21 MED ORDER — PHENYLEPHRINE HCL-NACL 20-0.9 MG/250ML-% IV SOLN
INTRAVENOUS | Status: DC | PRN
  Administered 2024-09-21: 25 ug/min via INTRAVENOUS

## 2024-09-21 MED ORDER — ROCURONIUM BROMIDE 10 MG/ML (PF) SYRINGE
PREFILLED_SYRINGE | INTRAVENOUS | Status: DC | PRN
  Administered 2024-09-21: 60 mg via INTRAVENOUS

## 2024-09-21 MED ORDER — SODIUM CHLORIDE 0.9% FLUSH: 3.0000 mL | INTRAVENOUS | Status: DC | PRN

## 2024-09-21 SURGICAL SUPPLY — 18 items
CABLE FARASTAR GEN2 SNGL USE (CABLE) IMPLANT
CATH EZ STEER NAV 8MM D-F CUR (ABLATOR) IMPLANT
CATH FARAWAVE 2.0 31 (CATHETERS) IMPLANT
CATH GE 8FR SOUNDSTAR (CATHETERS) IMPLANT
CATH OCTARAY 2.0 F 3-3-3-3-3 (CATHETERS) IMPLANT
CATH WEBSTER BI DIR CS D-F CRV (CATHETERS) IMPLANT
CLOSURE MYNX CONTROL 6F/7F (Vascular Products) IMPLANT
COVER SWIFTLINK CONNECTOR (BAG) ×1 IMPLANT
DILATOR VESSEL 38 20CM 16FR (INTRODUCER) IMPLANT
GUIDEWIRE INQWIRE 1.5J.035X260 (WIRE) IMPLANT
KIT VERSACROSS CNCT FARADRIVE (KITS) IMPLANT
PACK EP LF (CUSTOM PROCEDURE TRAY) ×1 IMPLANT
PAD DEFIB RADIO PHYSIO CONN (PAD) ×1 IMPLANT
PATCH CARTO3 (PAD) IMPLANT
SHEATH FARADRIVE STEERABLE (SHEATH) IMPLANT
SHEATH PINNACLE 8F 10CM (SHEATH) IMPLANT
SHEATH PINNACLE 9F 10CM (SHEATH) IMPLANT
SHEATH PROBE COVER 6X72 (BAG) IMPLANT

## 2024-09-21 NOTE — Transfer of Care (Signed)
 Immediate Anesthesia Transfer of Care Note  Patient: Maxwell Hopkins  Procedure(s) Performed: ATRIAL FIBRILLATION ABLATION  Patient Location: PACU  Anesthesia Type:General  Level of Consciousness: awake  Airway & Oxygen Therapy: Patient Spontanous Breathing and Patient connected to nasal cannula oxygen  Post-op Assessment: Report given to RN and Post -op Vital signs reviewed and stable  Post vital signs: Reviewed and stable  Last Vitals:  Vitals Value Taken Time  BP 134/86 09/21/24 12:31  Temp 36.7 C 09/21/24 12:37  Pulse 66 09/21/24 12:41  Resp 20 09/21/24 12:41  SpO2 98 % 09/21/24 12:41  Vitals shown include unfiled device data.  Last Pain:  Vitals:   09/21/24 1237  TempSrc: Axillary  PainSc: 0-No pain         Complications: There were no known notable events for this encounter.

## 2024-09-21 NOTE — Anesthesia Procedure Notes (Signed)
 Procedure Name: Intubation Date/Time: 09/21/2024 11:12 AM  Performed by: Delores Dus, CRNAPre-anesthesia Checklist: Patient identified, Emergency Drugs available, Suction available and Patient being monitored Patient Re-evaluated:Patient Re-evaluated prior to induction Oxygen Delivery Method: Circle system utilized Preoxygenation: Pre-oxygenation with 100% oxygen Induction Type: IV induction Ventilation: Mask ventilation without difficulty Laryngoscope Size: Miller and 2 Grade View: Grade II Tube type: Oral Tube size: 7.0 mm Number of attempts: 1 Airway Equipment and Method: Stylet and Oral airway Placement Confirmation: ETT inserted through vocal cords under direct vision, positive ETCO2 and breath sounds checked- equal and bilateral Secured at: 22 cm Tube secured with: Tape Dental Injury: Teeth and Oropharynx as per pre-operative assessment

## 2024-09-21 NOTE — Discharge Instructions (Addendum)
 Maxwell Hopkins

## 2024-09-21 NOTE — Progress Notes (Signed)
 Discharge instructions reviewed with patient, Tita and Eric the spanish interpreter. All question/ concerns were addressed. PT and caretaker verbalize understanding. PT tolerate PO intake. Ambulated in the hallway, no s/s of complications. Able to void without difficulty. PT escorted from the unit via wheel chair to personal vehicle.

## 2024-09-21 NOTE — H&P (Signed)
  Electrophysiology Office Note:   Date:  09/21/2024  ID:  Maxwell Hopkins, Maxwell Hopkins 1946-05-27, MRN [id number]  Primary Cardiologist: [name], MD Primary Heart Failure: None Electrophysiologist: Louvinia Cumbo Gladis Norton, MD      History of Present Illness:   Maxwell Hopkins is a 78 y.o. male with h/o, hyperlipidemia, diabetes, atrial fibrillation/flutter, sleep apnea seen today for routine electrophysiology followup.   Today, denies symptoms of palpitations, chest pain, dyspnea, orthopnea, PND, lower extremity edema, claudication, dizziness, presyncope, syncope, bleeding, or neurologic sequela. The patient is tolerating medications without difficulties. Plan ablation today.   EP Information / Studies Reviewed:    EKG is not ordered today. EKG from 05/28/24 reviewed which showed sinus rhythm        Risk Assessment/Calculations:    CHA2DS2-VASc Score = 4   This indicates a 4.8% annual risk of stroke. The patient's score is based upon: CHF History: 0 HTN History: 1 Diabetes History: 0 Stroke History: 0 Vascular Disease History: 1 Age Score: 2 Gender Score: 0            Physical Exam:   VS:  BP 135/82   Pulse 68   Temp 98 F (36.7 C) (Oral)   Resp 17   Ht 5' 6 (1.676 m)   Wt 72.6 kg   SpO2 94%   BMI 25.82 kg/m    Wt Readings from Last 3 Encounters:  09/21/24 72.6 kg  06/16/24 74.4 kg  05/28/24 73.7 kg    GEN: Well nourished, well developed in no acute distress NECK: No JVD; No carotid bruits CARDIAC: Regular rate and rhythm, no murmurs, rubs, gallops RESPIRATORY:  Clear to auscultation without rales, wheezing or rhonchi  ABDOMEN: Soft, non-tender, non-distended EXTREMITIES:  No edema; No deformity    ASSESSMENT AND PLAN:    1.  Paroxysmal atrial fibrillation/flutter: Maxwell Hopkins has presented today for surgery, with the diagnosis of AF.  The various methods of treatment have been discussed with the patient and family. After consideration of risks,  benefits and other options for treatment, the patient has consented to  Procedure(s): Catheter ablation as a surgical intervention .  Risks include but not limited to complete heart block, stroke, esophageal damage, nerve damage, bleeding, vascular damage, tamponade, perforation, MI, and death. The patient's history has been reviewed, patient examined, no change in status, stable for surgery.  I have reviewed the patient's chart and labs.  Questions were answered to the patient's satisfaction.    Haven Foss Norton, MD 09/21/2024 10:33 AM

## 2024-09-21 NOTE — Progress Notes (Signed)
 Communicating with patient through translator.

## 2024-09-22 ENCOUNTER — Ambulatory Visit: Admitting: Cardiology

## 2024-09-22 ENCOUNTER — Telehealth (HOSPITAL_COMMUNITY): Payer: Self-pay

## 2024-09-22 ENCOUNTER — Encounter (HOSPITAL_COMMUNITY): Payer: Self-pay | Admitting: Cardiology

## 2024-09-22 MED FILL — Fentanyl Citrate Preservative Free (PF) Inj 100 MCG/2ML: INTRAMUSCULAR | Qty: 2 | Status: AC

## 2024-09-22 NOTE — Anesthesia Postprocedure Evaluation (Signed)
 Anesthesia Post Note  Patient: Maxwell Hopkins  Procedure(s) Performed: ATRIAL FIBRILLATION ABLATION     Patient location during evaluation: PACU Anesthesia Type: General Level of consciousness: awake and alert Pain management: pain level controlled Vital Signs Assessment: post-procedure vital signs reviewed and stable Respiratory status: spontaneous breathing, nonlabored ventilation, respiratory function stable and patient connected to nasal cannula oxygen Cardiovascular status: blood pressure returned to baseline and stable Postop Assessment: no apparent nausea or vomiting Anesthetic complications: no   There were no known notable events for this encounter.  Last Vitals:  Vitals:   09/21/24 1600 09/21/24 1633  BP: 133/84 132/78  Pulse: (!) 59   Resp: 15   Temp:    SpO2: 94%     Last Pain:  Vitals:   09/21/24 1324  TempSrc:   PainSc: 0-No pain                 Cordella P Skarlet Lyons

## 2024-09-22 NOTE — Telephone Encounter (Signed)
 Spoke with patient via Maxwell Hopkins # [id number], to complete post procedure follow up call. Patient reports no complications with groin sites.   Instructions reviewed with patient:  Remove large bandage at puncture site after 24 hours. It is normal to have bruising, tenderness, mild swelling, and a pea or marble sized lump/knot at the groin site which can take up to three months to resolve.  Get help right away if you notice sudden swelling at the puncture site.  Check your puncture site every day for signs of infection: fever, redness, swelling, pus drainage, warmth, foul odor or excessive pain. If this occurs, please call [phone], to speak with the RN Navigator. Get help right away if your puncture site is bleeding and the bleeding does not stop after applying firm pressure to the area.  You may continue to have skipped beats/ atrial fibrillation during the first several months after your procedure.  It is very important not to miss any doses of your blood thinner Eliquis .    You will follow up with the Afib clinic on 10/19/24 and follow up with the Afib clinic on 01/11/25.   Patient verbalized understanding to all instructions provided.

## 2024-09-27 ENCOUNTER — Ambulatory Visit: Attending: Cardiology | Admitting: Cardiology

## 2024-09-27 ENCOUNTER — Encounter: Payer: Self-pay | Admitting: Cardiology

## 2024-09-27 VITALS — BP 126/74 | HR 78 | Ht 66.0 in | Wt 161.0 lb

## 2024-09-27 DIAGNOSIS — E782 Mixed hyperlipidemia: Secondary | ICD-10-CM | POA: Diagnosis not present

## 2024-09-27 DIAGNOSIS — I480 Paroxysmal atrial fibrillation: Secondary | ICD-10-CM | POA: Diagnosis not present

## 2024-09-27 DIAGNOSIS — I10 Essential (primary) hypertension: Secondary | ICD-10-CM

## 2024-09-27 DIAGNOSIS — R7303 Prediabetes: Secondary | ICD-10-CM

## 2024-09-27 DIAGNOSIS — I2510 Atherosclerotic heart disease of native coronary artery without angina pectoris: Secondary | ICD-10-CM | POA: Diagnosis not present

## 2024-09-27 NOTE — Progress Notes (Signed)
 Cardiology Office Note:    Date:  09/27/2024   ID:  Talin, Feister Aug 20, 1946, MRN [id number]  PCP:  Trinidad Glisson, MD  Cardiologist:  [name], MD    Referring MD: Trinidad Glisson, MD   Chief Complaint  Patient presents with   Follow-up    History of Present Illness:    Maxwell Hopkins is a 78 y.o. male past medical history significant for paroxysmal atrial fibrillation rhythm control strategy initially with flecainide  but just literally few days ago he did have atrial fibrillation ablation.  Since that time he is doing well.  He is coronary CT angio showed completely occluded circumflex artery luckily he is asymptomatic after that cardiac catheterization confirmed presence of complete and right circumflex artery with multiple stenosis but no critical LAD medical therapy has been initiated.  Since that time he is doing very well.  He walks on the regular basis for about 20 minutes 4-5 times a week with no difficulties denies have any chest pain tightness squeezing pressure mid chest.  Happy and satisfied with results of atrial fibrillation ablation  Past Medical History:  Diagnosis Date   Atrial flutter (HCC) 10/05/2021   Atrial tachycardia    Borderline diabetes 10/05/2021   CAD (coronary artery disease) 03/10/2024   Calcium  score 1961 on coronary CTA   Dyslipidemia 10/05/2021   ED (erectile dysfunction)    Elevated Lp(a) 2025   Essential hypertension 10/05/2021   Hyperlipidemia    Hypertension    Obstructive sleep apnea 04/10/2023   Palpitations    Paroxysmal atrial fibrillation (HCC) 11/07/2021   PFO (patent foramen ovale) 03/10/2024   Prediabetes     Past Surgical History:  Procedure Laterality Date   ATRIAL FIBRILLATION ABLATION N/A 09/21/2024   Procedure: ATRIAL FIBRILLATION ABLATION;  Surgeon: Inocencio Soyla Lunger, MD;  Location: [facility];  Service: Cardiovascular;  Laterality: N/A;   CORONARY PRESSURE/FFR WITH 3D MAPPING N/A 03/15/2024    Procedure: Coronary Pressure/FFR w/3D Mapping;  Surgeon: Ladona Heinz, MD;  Location: [facility];  Service: Cardiovascular;  Laterality: N/A;   LEFT HEART CATH AND CORONARY ANGIOGRAPHY N/A 03/15/2024   Procedure: LEFT HEART CATH AND CORONARY ANGIOGRAPHY;  Surgeon: Ladona Heinz, MD;  Location: [facility];  Service: Cardiovascular;  Laterality: N/A;   NO PAST SURGERIES      Current Medications: Current Meds  Medication Sig   acetaminophen  (TYLENOL ) 500 MG tablet Take 500-1,000 mg by mouth every 6 (six) hours as needed (pain.).   amiodarone  (PACERONE ) 200 MG tablet Take 1 tablet (200 mg total) by mouth daily.   Ascorbic Acid (VITAMIN C) 1000 MG tablet Take 1,000 mg by mouth in the morning.   atorvastatin  (LIPITOR) 80 MG tablet Take 1 tablet (80 mg total) by mouth daily.   Bempedoic Acid-Ezetimibe  (NEXLIZET ) 180-10 MG TABS Take 1 tablet by mouth daily.   ELIQUIS  5 MG TABS tablet TOME 1 TABLETA POR VIA ORAL DOS VECES AL DIA   fluticasone (FLONASE) 50 MCG/ACT nasal spray Place 1 spray into both nostrils 2 (two) times daily.   meclizine (ANTIVERT) 25 MG tablet Take 25 mg by mouth every 8 (eight) hours as needed for dizziness.   metoprolol  succinate (TOPROL -XL) 25 MG 24 hr tablet TAKE 1 TABLET (25 MG TOTAL) BY MOUTH DAILY.   Multiple Vitamins-Minerals (MULTIVITAMIN WITH MINERALS) tablet Take 1 tablet by mouth in the morning. Centrum Silver Men 50+   multivitamin-lutein (OCUVITE-LUTEIN) CAPS capsule Take 1 capsule by mouth in the  morning.   nitroGLYCERIN  (NITROSTAT ) 0.4 MG SL tablet Place 1 tablet (0.4 mg total) under the tongue every 5 (five) minutes as needed for chest pain.   Polyethyl Glycol-Propyl Glycol (LUBRICANT EYE DROPS) 0.4-0.3 % SOLN Place 1-2 drops into both eyes 3 (three) times daily as needed (dry/irritated eyes.).   tadalafil (CIALIS) 5 MG tablet Take 5 mg by mouth every evening.   tamsulosin (FLOMAX) 0.4 MG CAPS capsule Take 0.4 mg by mouth in the morning and at bedtime.      Allergies:   Penicillin g   Social History   Socioeconomic History   Marital status: Married    Spouse name: Not on file   Number of children: Not on file   Years of education: Not on file   Highest education level: Not on file  Occupational History   Not on file  Tobacco Use   Smoking status: Never   Smokeless tobacco: Never  Substance and Sexual Activity   Alcohol  use: Not Currently   Drug use: Never   Sexual activity: Yes  Other Topics Concern   Not on file  Social History Narrative   Not on file   Social Drivers of Health   Financial Resource Strain: Not on file  Food Insecurity: Not on file  Transportation Needs: Not on file  Physical Activity: Not on file  Stress: Not on file  Social Connections: Not on file     Family History: The patient's Family history is unknown by patient. ROS:   Please see the history of present illness.    All 14 point review of systems negative except as described per history of present illness  EKGs/Labs/Other Studies Reviewed:         Recent Labs: 07/14/2024: ALT 28 08/31/2024: BUN 20; Creatinine, Ser 0.97; Hemoglobin 11.8; Platelets 186; Potassium 4.4; Sodium 135  Recent Lipid Panel    Component Value Date/Time   CHOL 107 07/14/2024 0831   TRIG 42 07/14/2024 0831   HDL 46 07/14/2024 0831   CHOLHDL 2.3 07/14/2024 0831   LDLCALC 50 07/14/2024 0831   LDLDIRECT 78 04/01/2024 0000    Physical Exam:    VS:  BP 126/74   Pulse 78   Ht 5' 6 (1.676 m)   Wt 161 lb (73 kg)   SpO2 98%   BMI 25.99 kg/m     Wt Readings from Last 3 Encounters:  09/27/24 161 lb (73 kg)  09/21/24 160 lb (72.6 kg)  06/16/24 164 lb (74.4 kg)     GEN:  Well nourished, well developed in no acute distress HEENT: Normal NECK: No JVD; No carotid bruits LYMPHATICS: No lymphadenopathy CARDIAC: RRR, no murmurs, no rubs, no gallops RESPIRATORY:  Clear to auscultation without rales, wheezing or rhonchi  ABDOMEN: Soft, non-tender,  non-distended MUSCULOSKELETAL:  No edema; No deformity  SKIN: Warm and dry LOWER EXTREMITIES: no swelling NEUROLOGIC:  Alert and oriented x 3 PSYCHIATRIC:  Normal affect   ASSESSMENT:    1. Paroxysmal atrial fibrillation (HCC)   2. Essential hypertension   3. Coronary artery disease involving native coronary artery of native heart without angina pectoris   4. Mixed hyperlipidemia   5. Prediabetes    PLAN:    In order of problems listed above:  Paroxysmal atrial fibrillation status post ablation doing well continue anticoagulation anticipate any future we will stop his amiodarone . Essential hypertension blood pressure well-controlled continue present management. Coronary artery disease with completely with circumflex artery completely asymptomatic.  I advised him to  continue exercising on the regular basis will continue beta-blocker 8 will continue Lipitor. Dyslipidemia I did review KPN which show me his LDL 50 HDL 46 we will continue present management   Medication Adjustments/Labs and Tests Ordered: Current medicines are reviewed at length with the patient today.  Concerns regarding medicines are outlined above.  Orders Placed This Encounter  Procedures   EKG 12-Lead   Medication changes: No orders of the defined types were placed in this encounter.   Signed, Lamar DOROTHA Fitch, MD, [facility] 09/27/2024 1:15 PM    [facility]

## 2024-09-27 NOTE — Patient Instructions (Signed)

## 2024-09-29 DIAGNOSIS — R6883 Chills (without fever): Secondary | ICD-10-CM | POA: Diagnosis not present

## 2024-09-29 DIAGNOSIS — E785 Hyperlipidemia, unspecified: Secondary | ICD-10-CM | POA: Diagnosis not present

## 2024-09-29 DIAGNOSIS — I10 Essential (primary) hypertension: Secondary | ICD-10-CM | POA: Diagnosis not present

## 2024-10-19 ENCOUNTER — Encounter (HOSPITAL_COMMUNITY): Payer: Self-pay | Admitting: Internal Medicine

## 2024-10-19 ENCOUNTER — Ambulatory Visit (HOSPITAL_COMMUNITY)
Admission: RE | Admit: 2024-10-19 | Discharge: 2024-10-19 | Disposition: A | Discharge status: Home / Self Care | Source: Ambulatory Visit | Attending: Internal Medicine | Admitting: Internal Medicine

## 2024-10-19 VITALS — BP 130/80 | HR 63 | Ht 66.0 in | Wt 165.0 lb

## 2024-10-19 DIAGNOSIS — D6869 Other thrombophilia: Secondary | ICD-10-CM

## 2024-10-19 DIAGNOSIS — I480 Paroxysmal atrial fibrillation: Secondary | ICD-10-CM

## 2024-10-19 DIAGNOSIS — Z79899 Other long term (current) drug therapy: Secondary | ICD-10-CM

## 2024-10-19 DIAGNOSIS — Z5181 Encounter for therapeutic drug level monitoring: Secondary | ICD-10-CM
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
## 2024-10-30 DIAGNOSIS — E785 Hyperlipidemia, unspecified: Secondary | ICD-10-CM | POA: Diagnosis not present

## 2024-10-30 DIAGNOSIS — I10 Essential (primary) hypertension: Secondary | ICD-10-CM | POA: Diagnosis not present

## 2024-11-03 ENCOUNTER — Telehealth: Payer: Self-pay | Admitting: Pharmacy Technician

## 2024-11-03 NOTE — Telephone Encounter (Signed)
 Pharmacy Patient Advocate Encounter  Received notification from [facility] that Prior Authorization for nexlizet  has been APPROVED from 11/03/24 to 12/29/25   PA #/Case ID/Reference #: [id number]

## 2024-11-03 NOTE — Telephone Encounter (Signed)
   Pharmacy Patient Advocate Encounter   Received notification from Onbase that prior authorization for NEXLIZET  is required/requested.   Insurance verification completed.   The patient is insured through [employer].   Per test claim: PA required; PA submitted to above mentioned insurance via Latent Key/confirmation #/EOC [id number] Status is pending

## 2024-12-01 ENCOUNTER — Other Ambulatory Visit: Payer: Self-pay | Admitting: Cardiology

## 2024-12-01 DIAGNOSIS — I4891 Unspecified atrial fibrillation: Secondary | ICD-10-CM

## 2024-12-01 NOTE — Telephone Encounter (Signed)
 Prescription refill request for Eliquis  received. Indication:afib Last office visit:10/25 Scr: 0.97  9/25 Age:78 Weight:74.8  kg  Prescription refilled

## 2025-01-05 ENCOUNTER — Telehealth: Payer: Self-pay | Admitting: Cardiology

## 2025-01-05 DIAGNOSIS — I2510 Atherosclerotic heart disease of native coronary artery without angina pectoris: Secondary | ICD-10-CM

## 2025-01-05 DIAGNOSIS — I10 Essential (primary) hypertension: Secondary | ICD-10-CM

## 2025-01-05 DIAGNOSIS — G4733 Obstructive sleep apnea (adult) (pediatric): Secondary | ICD-10-CM

## 2025-01-05 NOTE — Telephone Encounter (Signed)
 Pt is spanish speaking and dropped off a note stating I had my CPAP machine with [facility] but they dont accept [employer]. They need auth from [facility] to switch to another provider

## 2025-01-06 NOTE — Telephone Encounter (Signed)
 Message sent to Sleep Study pool for further assist

## 2025-01-06 NOTE — Telephone Encounter (Signed)
 Pt is requesting a callback to f/u on this matter since he dropped off the note. Please advise

## 2025-01-11 ENCOUNTER — Ambulatory Visit (HOSPITAL_COMMUNITY): Admitting: Internal Medicine

## 2025-01-11 ENCOUNTER — Other Ambulatory Visit: Payer: Self-pay | Admitting: Cardiology

## 2025-01-12 NOTE — Addendum Note (Signed)
 Addended by: JOSHUA BRAD MATSU on: 01/12/2025 03:16 PM   Modules accepted: Orders

## 2025-01-12 NOTE — Telephone Encounter (Signed)
 Patient had an insurance change and had to change to another dme.  Upon patient request DME selection is [facility]. Please add to airview Patient was grateful for the call and thanked me.

## 2025-01-13 NOTE — Telephone Encounter (Signed)
 [employer] calling    [phone] [facility] will be sumitted

## 2025-01-17 NOTE — Addendum Note (Signed)
 Addended by: JOSHUA BRAD MATSU on: 01/17/2025 02:00 PM   Modules accepted: Orders

## 2025-01-17 NOTE — Telephone Encounter (Signed)
 Caller [facility]) is following up on status of patient's CPAP supplies. Caller noted patient recently changed insurance companies - new CPAP supplies vendor is [employer], phone# [phone], fax# [phone] and wants orders sent to vendor.   Reached out to [facility] who is the New dme for the patient and spoke to Nwayna who states they were the dme for [employer]. Nwayna asked me to send all paperwork for the patient and they would process his cpap machine. All paperwork faxed to [phone].

## 2025-01-17 NOTE — Telephone Encounter (Signed)
 Caller [facility]) is following up on status of patient's CPAP supplies.  Caller noted patient recently changed insurance companies - new CPAP supplies vendor is [employer], phone# [phone], fax# [phone] and wants orders sent to vendor.

## 2025-01-25 ENCOUNTER — Ambulatory Visit (HOSPITAL_COMMUNITY): Admitting: Internal Medicine

## 2025-02-01 ENCOUNTER — Ambulatory Visit (HOSPITAL_COMMUNITY): Admitting: Internal Medicine

## 2025-02-15 ENCOUNTER — Ambulatory Visit (HOSPITAL_COMMUNITY): Admitting: Internal Medicine

## 2025-03-24 ENCOUNTER — Ambulatory Visit: Admitting: Cardiology
